# Patient Record
Sex: Female | Born: 1995 | Hispanic: Yes | Marital: Single | Smoking: Never smoker
Health system: Southern US, Community
[De-identification: ages and names within clinical notes are randomized; demographics above are authoritative.]

## PROBLEM LIST (undated history)

## (undated) DIAGNOSIS — E559 Vitamin D deficiency, unspecified: Secondary | ICD-10-CM

## (undated) DIAGNOSIS — Z91018 Allergy to other foods: Secondary | ICD-10-CM

## (undated) DIAGNOSIS — A6000 Herpesviral infection of urogenital system, unspecified: Secondary | ICD-10-CM

## (undated) DIAGNOSIS — F1010 Alcohol abuse, uncomplicated: Secondary | ICD-10-CM

## (undated) DIAGNOSIS — K219 Gastro-esophageal reflux disease without esophagitis: Secondary | ICD-10-CM

## (undated) DIAGNOSIS — K5900 Constipation, unspecified: Secondary | ICD-10-CM

## (undated) DIAGNOSIS — N979 Female infertility, unspecified: Secondary | ICD-10-CM

## (undated) DIAGNOSIS — D649 Anemia, unspecified: Secondary | ICD-10-CM

## (undated) DIAGNOSIS — F429 Obsessive-compulsive disorder, unspecified: Secondary | ICD-10-CM

## (undated) DIAGNOSIS — E282 Polycystic ovarian syndrome: Secondary | ICD-10-CM

## (undated) DIAGNOSIS — F32A Depression, unspecified: Secondary | ICD-10-CM

## (undated) DIAGNOSIS — J45909 Unspecified asthma, uncomplicated: Secondary | ICD-10-CM

## (undated) DIAGNOSIS — F419 Anxiety disorder, unspecified: Secondary | ICD-10-CM

## (undated) DIAGNOSIS — F329 Major depressive disorder, single episode, unspecified: Secondary | ICD-10-CM

## (undated) DIAGNOSIS — F909 Attention-deficit hyperactivity disorder, unspecified type: Secondary | ICD-10-CM

## (undated) HISTORY — DX: Vitamin D deficiency, unspecified: E55.9

## (undated) HISTORY — DX: Attention-deficit hyperactivity disorder, unspecified type: F90.9

## (undated) HISTORY — DX: Herpesviral infection of urogenital system, unspecified: A60.00

## (undated) HISTORY — DX: Anxiety disorder, unspecified: F41.9

## (undated) HISTORY — DX: Allergy to other foods: Z91.018

## (undated) HISTORY — DX: Depression, unspecified: F32.A

## (undated) HISTORY — DX: Constipation, unspecified: K59.00

## (undated) HISTORY — DX: Obsessive-compulsive disorder, unspecified: F42.9

## (undated) HISTORY — DX: Gastro-esophageal reflux disease without esophagitis: K21.9

## (undated) HISTORY — DX: Anemia, unspecified: D64.9

## (undated) HISTORY — DX: Female infertility, unspecified: N97.9

## (undated) HISTORY — DX: Alcohol abuse, uncomplicated: F10.10

## (undated) HISTORY — DX: Major depressive disorder, single episode, unspecified: F32.9

---
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
## 2015-02-10 DIAGNOSIS — R519 Headache, unspecified: Secondary | ICD-10-CM | POA: Insufficient documentation

## 2015-02-10 DIAGNOSIS — R51 Headache: Secondary | ICD-10-CM

## 2015-02-19 ENCOUNTER — Ambulatory Visit: Payer: No Typology Code available for payment source | Attending: Internal Medicine | Admitting: Internal Medicine

## 2015-02-19 ENCOUNTER — Encounter: Payer: Self-pay | Admitting: Internal Medicine

## 2015-02-19 VITALS — BP 108/73 | HR 90 | Temp 98.0°F | Resp 16 | Wt 179.4 lb

## 2015-02-19 DIAGNOSIS — G44219 Episodic tension-type headache, not intractable: Secondary | ICD-10-CM

## 2015-02-19 DIAGNOSIS — Z23 Encounter for immunization: Secondary | ICD-10-CM | POA: Diagnosis not present

## 2015-02-19 NOTE — Progress Notes (Signed)
Patient here for follow up Patient states that she recently had a headache That lasted for six days No history of migraines and her headache was not around the  Same time as her  Monthly cycle

## 2015-02-19 NOTE — Progress Notes (Signed)
Subjective:     Patient ID: Catherine Lane, female   DOB: 12/08/1995, 19 y.o.   MRN: [id number]  HPI   Kaysen L Kathleene Hazel is a 19 yr old female who presents today after 6 days of headaches which started on September 12 and has now resolved. Patient described pain that was worse in the morning and at bed time and resolved during the day. Pain was throbbing and mostly in frontal region, pain did not radiate. Patient does not know of any precipitant to headache episode. Patient stated she took aspirin with moderate relief.  Pain not associated with photophobia or phonophobia. Patient denies current headache pain, fever, chest or abdominal pain, nausea, vomiting or diarrhea.  Blood pressure 108/73, pulse 90, temperature 98 F (36.7 C), resp. rate 16, weight 179 lb 6.4 oz (81.375 kg), SpO2 100 %.  Review of Systems  Constitutional: Negative.   Eyes: Negative.   Respiratory: Negative.   Gastrointestinal: Negative.   Endocrine: Negative.   Genitourinary: Negative.   Musculoskeletal: Negative.   Skin: Negative.   Allergic/Immunologic: Negative.   Hematological: Negative.   Psychiatric/Behavioral: Negative.        Objective:   Physical Exam  Constitutional: She is oriented to person, place, and time. She appears well-developed and well-nourished.  HENT:  Head: Normocephalic.  Eyes: Pupils are equal, round, and reactive to light.  Neck: Normal range of motion. Neck supple.  Cardiovascular: Normal rate, regular rhythm, normal heart sounds and intact distal pulses.  Exam reveals no gallop and no friction rub.   No murmur heard. Pulmonary/Chest: Effort normal and breath sounds normal.  Abdominal: Bowel sounds are normal.  Musculoskeletal: Normal range of motion.  Neurological: She is alert and oriented to person, place, and time.  Skin: Skin is warm and dry.  Psychiatric: She has a normal mood and affect. Her behavior is normal. Judgment and thought content normal.        Assessment:   Olivya was seen today for headache.  Diagnoses and all orders for this visit:  Episodic tension-type headache, not intractable  Need for influenza vaccination -     Flu Vaccine QUAD 36+ mos IM     Plan:     Episodic tension-type headache not intractable  Patient was instructed to keep a headache diary, with reference to symptoms, pain level, length of headache and any aggravating or alleviating factors.    Patient was instructed to take ibuprofen 600 mg every eight hours for headache pain.  Patient was encouraged to call he office if symptoms worsen or persist.   Return if symptoms worsen or fail to improve.   Stephanie Coup, RN, AGNP Student  02/19/2015 5:54 PM

## 2015-02-19 NOTE — Patient Instructions (Signed)
May use ibuprofen 800 mg for headaches. If no improvement please call me and I may send you a light muscle relaxer   Tension Headache A tension headache is a feeling of pain, pressure, or aching often felt over the front and sides of the head. The pain can be dull or can feel tight (constricting). It is the most common type of headache. Tension headaches are not normally associated with nausea or vomiting and do not get worse with physical activity. Tension headaches can last 30 minutes to several days.  CAUSES  The exact cause is not known, but it may be caused by chemicals and hormones in the brain that lead to pain. Tension headaches often begin after stress, anxiety, or depression. Other triggers may include:  Alcohol.  Caffeine (too much or withdrawal).  Respiratory infections (colds, flu, sinus infections).  Dental problems or teeth clenching.  Fatigue.  Holding your head and neck in one position too long while using a computer. SYMPTOMS   Pressure around the head.   Dull, aching head pain.   Pain felt over the front and sides of the head.   Tenderness in the muscles of the head, neck, and shoulders. DIAGNOSIS  A tension headache is often diagnosed based on:   Symptoms.   Physical examination.   A CT scan or MRI of your head. These tests may be ordered if symptoms are severe or unusual. TREATMENT  Medicines may be given to help relieve symptoms.  HOME CARE INSTRUCTIONS   Only take over-the-counter or prescription medicines for pain or discomfort as directed by your caregiver.   Lie down in a dark, quiet room when you have a headache.   Keep a journal to find out what may be triggering your headaches. For example, write down:  What you eat and drink.  How much sleep you get.  Any change to your diet or medicines.  Try massage or other relaxation techniques.   Ice packs or heat applied to the head and neck can be used. Use these 3 to 4 times per day  for 15 to 20 minutes each time, or as needed.   Limit stress.   Sit up straight, and do not tense your muscles.   Quit smoking if you smoke.  Limit alcohol use.  Decrease the amount of caffeine you drink, or stop drinking caffeine.  Eat and exercise regularly.  Get 7 to 9 hours of sleep, or as recommended by your caregiver.  Avoid excessive use of pain medicine as recurrent headaches can occur.  SEEK MEDICAL CARE IF:   You have problems with the medicines you were prescribed.  Your medicines do not work.  You have a change from the usual headache.  You have nausea or vomiting. SEEK IMMEDIATE MEDICAL CARE IF:   Your headache becomes severe.  You have a fever.  You have a stiff neck.  You have loss of vision.  You have muscular weakness or loss of muscle control.  You lose your balance or have trouble walking.  You feel faint or pass out.  You have severe symptoms that are different from your first symptoms. MAKE SURE YOU:   Understand these instructions.  Will watch your condition.  Will get help right away if you are not doing well or get worse. Document Released: 05/17/2005 Document Revised: 08/09/2011 Document Reviewed: 05/07/2011 [facility] Patient Information 2015 [place], [place]. This information is not intended to replace advice given to you by your health care provider. Make sure  you discuss any questions you have with your health care provider.

## 2015-05-28 ENCOUNTER — Encounter: Payer: Self-pay | Admitting: Internal Medicine

## 2015-05-28 ENCOUNTER — Ambulatory Visit: Payer: 59 | Attending: Internal Medicine | Admitting: Internal Medicine

## 2015-05-28 VITALS — BP 126/84 | HR 89 | Temp 98.0°F | Resp 16 | Ht 62.0 in | Wt 173.4 lb

## 2015-05-28 DIAGNOSIS — R109 Unspecified abdominal pain: Secondary | ICD-10-CM | POA: Insufficient documentation

## 2015-05-28 DIAGNOSIS — E282 Polycystic ovarian syndrome: Secondary | ICD-10-CM | POA: Insufficient documentation

## 2015-05-28 DIAGNOSIS — R102 Pelvic and perineal pain: Secondary | ICD-10-CM | POA: Diagnosis not present

## 2015-05-28 LAB — POCT URINALYSIS DIPSTICK
Glucose, UA: NEGATIVE
KETONES UA: NEGATIVE
LEUKOCYTES UA: NEGATIVE
Nitrite, UA: NEGATIVE
Protein, UA: >=300
Urobilinogen, UA: 0.2
pH, UA: 5.5

## 2015-05-28 LAB — POCT URINE PREGNANCY: Preg Test, Ur: NEGATIVE

## 2015-05-28 NOTE — Progress Notes (Signed)
Patient complains of left sided abd pain for the past two days Denies any fever Having some diarrhea and some nausea as well

## 2015-05-28 NOTE — Progress Notes (Signed)
   Subjective:    Patient ID: Catherine Lane, female    DOB: 1995/10/12, 19 y.o.   MRN: [id number]  HPI Comments: Was told in June that she has PCOS and was started on birth control. She reports that she recently missed 2 weeks of her birth control which caused her to have 2 weeks of a cycle.   Abdominal Pain This is a new problem. The current episode started in the past 7 days. The pain is located in the LLQ (pelvic). The quality of the pain is aching ("achy muscle"). The abdominal pain does not radiate. Associated symptoms include diarrhea (usually has before her period) and nausea. Pertinent negatives include no fever or vomiting. Exacerbated by: bending, walking, lifting heavy objects. Treatments tried: NSAID's. The treatment provided significant relief. Ovarian Cyst    Review of Systems  Constitutional: Negative for fever.  Gastrointestinal: Positive for nausea, abdominal pain and diarrhea (usually has before her period). Negative for vomiting.  Genitourinary: Positive for pelvic pain and dyspareunia. Negative for vaginal discharge, difficulty urinating and vaginal pain.       Objective:   Physical Exam  Constitutional: She is oriented to person, place, and time.  Abdominal: Soft. Bowel sounds are normal. There is tenderness (pelvic, LLQ).  Musculoskeletal: She exhibits no tenderness.  Neurological: She is alert and oriented to person, place, and time.  Skin: Skin is warm and dry.  Psychiatric: She has a normal mood and affect.      Assessment & Plan:  Catherine Lane was seen today for abdominal pain.  Diagnoses and all orders for this visit:  Abdominal pain, unspecified abdominal location -     Urinalysis Dipstick -     POCT urine pregnancy  PCOS (polycystic ovarian syndrome) I believe when patient missed her OCP, caused her hormones to be off. Likely patient has ovarian cyst causing abdominal pain. I have encouraged regular use of OCP as directed. NSAID's encouraged. If pain  becomes worse she may call her GYN back for further evaluation.   Return if symptoms worsen or fail to improve.   Ambrose FinlandValerie A Binyomin Brann, NP 05/28/2015 9:57 AM

## 2015-05-28 NOTE — Patient Instructions (Signed)
Ovarian Cyst An ovarian cyst is a fluid-filled sac that forms on an ovary. The ovaries are small organs that produce eggs in women. Various types of cysts can form on the ovaries. Most are not cancerous. Many do not cause problems, and they often go away on their own. Some may cause symptoms and require treatment. Common types of ovarian cysts include:  Functional cysts--These cysts may occur every month during the menstrual cycle. This is normal. The cysts usually go away with the next menstrual cycle if the woman does not get pregnant. Usually, there are no symptoms with a functional cyst.  Endometrioma cysts--These cysts form from the tissue that lines the uterus. They are also called "chocolate cysts" because they become filled with blood that turns brown. This type of cyst can cause pain in the lower abdomen during intercourse and with your menstrual period.  Cystadenoma cysts--This type develops from the cells on the outside of the ovary. These cysts can get very big and cause lower abdomen pain and pain with intercourse. This type of cyst can twist on itself, cut off its blood supply, and cause severe pain. It can also easily rupture and cause a lot of pain.  Dermoid cysts--This type of cyst is sometimes found in both ovaries. These cysts may contain different kinds of body tissue, such as skin, teeth, hair, or cartilage. They usually do not cause symptoms unless they get very big.  Theca lutein cysts--These cysts occur when too much of a certain hormone (human chorionic gonadotropin) is produced and overstimulates the ovaries to produce an egg. This is most common after procedures used to assist with the conception of a baby (in vitro fertilization). CAUSES   Fertility drugs can cause a condition in which multiple large cysts are formed on the ovaries. This is called ovarian hyperstimulation syndrome.  A condition called polycystic ovary syndrome can cause hormonal imbalances that can lead to  nonfunctional ovarian cysts. SIGNS AND SYMPTOMS  Many ovarian cysts do not cause symptoms. If symptoms are present, they may include:  Pelvic pain or pressure.  Pain in the lower abdomen.  Pain during sexual intercourse.  Increasing girth (swelling) of the abdomen.  Abnormal menstrual periods.  Increasing pain with menstrual periods.  Stopping having menstrual periods without being pregnant. DIAGNOSIS  These cysts are commonly found during a routine or annual pelvic exam. Tests may be ordered to find out more about the cyst. These tests may include:  Ultrasound.  X-ray of the pelvis.  CT scan.  MRI.  Blood tests. TREATMENT  Many ovarian cysts go away on their own without treatment. Your health care provider may want to check your cyst regularly for 2-3 months to see if it changes. For women in menopause, it is particularly important to monitor a cyst closely because of the higher rate of ovarian cancer in menopausal women. When treatment is needed, it may include any of the following:  A procedure to drain the cyst (aspiration). This may be done using a long needle and ultrasound. It can also be done through a laparoscopic procedure. This involves using a thin, lighted tube with a tiny camera on the end (laparoscope) inserted through a small incision.  Surgery to remove the whole cyst. This may be done using laparoscopic surgery or an open surgery involving a larger incision in the lower abdomen.  Hormone treatment or birth control pills. These methods are sometimes used to help dissolve a cyst. HOME CARE INSTRUCTIONS   Only take over-the-counter   or prescription medicines as directed by your health care provider.  Follow up with your health care provider as directed.  Get regular pelvic exams and Pap tests. SEEK MEDICAL CARE IF:   Your periods are late, irregular, or painful, or they stop.  Your pelvic pain or abdominal pain does not go away.  Your abdomen becomes  larger or swollen.  You have pressure on your bladder or trouble emptying your bladder completely.  You have pain during sexual intercourse.  You have feelings of fullness, pressure, or discomfort in your stomach.  You lose weight for no apparent reason.  You feel generally ill.  You become constipated.  You lose your appetite.  You develop acne.  You have an increase in body and facial hair.  You are gaining weight, without changing your exercise and eating habits.  You think you are pregnant. SEEK IMMEDIATE MEDICAL CARE IF:   You have increasing abdominal pain.  You feel sick to your stomach (nauseous), and you throw up (vomit).  You develop a fever that comes on suddenly.  You have abdominal pain during a bowel movement.  Your menstrual periods become heavier than usual. MAKE SURE YOU:  Understand these instructions.  Will watch your condition.  Will get help right away if you are not doing well or get worse.   This information is not intended to replace advice given to you by your health care provider. Make sure you discuss any questions you have with your health care provider.   Document Released: 05/17/2005 Document Revised: 05/22/2013 Document Reviewed: 01/22/2013 Elsevier Interactive Patient Education 2016 [employer]  

## 2015-10-17 ENCOUNTER — Encounter: Payer: Self-pay | Admitting: General Practice

## 2015-10-17 ENCOUNTER — Other Ambulatory Visit: Payer: Self-pay | Admitting: General Practice

## 2015-10-17 ENCOUNTER — Other Ambulatory Visit: Payer: Self-pay

## 2015-10-17 DIAGNOSIS — Z3041 Encounter for surveillance of contraceptive pills: Secondary | ICD-10-CM

## 2015-10-17 MED ORDER — NORGESTIMATE-ETH ESTRADIOL 0.25-35 MG-MCG PO TABS: 1.0000 | ORAL_TABLET | Freq: Every day | ORAL | Status: DC

## 2015-10-17 NOTE — Telephone Encounter (Signed)
Pt came to front office and requested a refill on her BCP.  Per Dr. Debroah LoopArnold, pt can have a refill of one year. Rx e-prescribed.

## 2015-10-22 ENCOUNTER — Telehealth: Payer: Self-pay | Admitting: General Practice

## 2015-10-22 DIAGNOSIS — Z3041 Encounter for surveillance of contraceptive pills: Secondary | ICD-10-CM

## 2015-10-22 MED ORDER — NORGESTIMATE-ETH ESTRADIOL 0.25-35 MG-MCG PO TABS: 1.0000 | ORAL_TABLET | Freq: Every day | ORAL | Status: DC

## 2015-10-22 NOTE — Telephone Encounter (Signed)
Patient called and left message stating she was seen a couple days for a refill on her birth control pills. Patient states she was told she didn't need an appt since she is not 21 yet & that a prescription can just go to her pharmacy. Patient states she went to the pharmacy but her medication isn't there. Called patient & informed her of Rx resubmitted. Patient verbalized understanding & had no questions

## 2015-10-24 ENCOUNTER — Telehealth: Payer: Self-pay | Admitting: Internal Medicine

## 2015-10-24 MED ORDER — ALBUTEROL SULFATE HFA 108 (90 BASE) MCG/ACT IN AERS: 2.0000 | INHALATION_SPRAY | Freq: Four times a day (QID) | RESPIRATORY_TRACT | Status: DC | PRN

## 2015-10-24 NOTE — Telephone Encounter (Signed)
Patient is requesting a prescription refill for albuterol...   Please follow up with patient

## 2015-10-24 NOTE — Telephone Encounter (Signed)
Albuterol inhaler refilled 

## 2016-04-21 ENCOUNTER — Ambulatory Visit: Payer: Self-pay

## 2016-04-29 ENCOUNTER — Encounter: Payer: Self-pay | Admitting: Licensed Clinical Social Worker

## 2016-04-29 ENCOUNTER — Ambulatory Visit: Payer: BLUE CROSS/BLUE SHIELD | Attending: Family Medicine | Admitting: Family Medicine

## 2016-04-29 ENCOUNTER — Encounter: Payer: Self-pay | Admitting: Family Medicine

## 2016-04-29 VITALS — BP 122/84 | HR 91 | Temp 99.1°F | Resp 16 | Ht 62.0 in | Wt 173.0 lb

## 2016-04-29 DIAGNOSIS — Z79899 Other long term (current) drug therapy: Secondary | ICD-10-CM | POA: Insufficient documentation

## 2016-04-29 DIAGNOSIS — M25579 Pain in unspecified ankle and joints of unspecified foot: Secondary | ICD-10-CM | POA: Diagnosis not present

## 2016-04-29 DIAGNOSIS — M25552 Pain in left hip: Secondary | ICD-10-CM | POA: Diagnosis not present

## 2016-04-29 DIAGNOSIS — M791 Myalgia: Secondary | ICD-10-CM | POA: Diagnosis not present

## 2016-04-29 DIAGNOSIS — M25551 Pain in right hip: Secondary | ICD-10-CM | POA: Insufficient documentation

## 2016-04-29 DIAGNOSIS — J4520 Mild intermittent asthma, uncomplicated: Secondary | ICD-10-CM | POA: Diagnosis not present

## 2016-04-29 DIAGNOSIS — Z0001 Encounter for general adult medical examination with abnormal findings: Secondary | ICD-10-CM | POA: Insufficient documentation

## 2016-04-29 DIAGNOSIS — Z23 Encounter for immunization: Secondary | ICD-10-CM | POA: Diagnosis not present

## 2016-04-29 DIAGNOSIS — M79609 Pain in unspecified limb: Secondary | ICD-10-CM

## 2016-04-29 MED ORDER — ALBUTEROL SULFATE HFA 108 (90 BASE) MCG/ACT IN AERS: 2.0000 | INHALATION_SPRAY | Freq: Four times a day (QID) | RESPIRATORY_TRACT | 0 refills | Status: DC | PRN

## 2016-04-29 MED ORDER — BECLOMETHASONE DIPROPIONATE 40 MCG/ACT IN AERS: 2.0000 | INHALATION_SPRAY | Freq: Two times a day (BID) | RESPIRATORY_TRACT | 4 refills | Status: DC

## 2016-04-29 MED ORDER — IBUPROFEN 600 MG PO TABS: 600.0000 mg | ORAL_TABLET | Freq: Three times a day (TID) | ORAL | 0 refills | Status: DC | PRN

## 2016-04-29 NOTE — BH Specialist Note (Signed)
Session Start time: 9:55 am   End Time: 10:15 am Total Time:  20 minutes Type of Service: Behavioral Health - Individual/Family Interpreter: No.   Interpreter Name & Language: N/A # [facility]   SUBJECTIVE: Catherine Lane is a 20 y.o. female  Pt. was referred by FNP Hairston for:  anxiety and depression. Pt. reports the following symptoms/concerns: frequent panic attacks, episodes of crying, and withdrawn behavior Duration of problem:  2 years Severity: moderate Previous treatment: Pt was diagnosed with Depression, Anxiety, and [facility] ago [facility]. [facility]. Pt no longer receiving services.   OBJECTIVE: Mood: Pleasant & Affect: Appropriate Risk of harm to self or others: Pt has hx of suicidal ideations; however, denied SI/HI at present time Assessments administered: PHQ-9; GAD-7  LIFE CONTEXT:  Family & Social: Pt resides with her parents. She has friends that she receives emotional support from School/ Work: Pt is a Consulting civil engineerstudent at [employer]. She receives financial support from parents and employer Self-Care: Pt reported that she wants to begin an exercise regimen; however, she is too busy with school and work. Pt denied substance use and did not report concerns with sleep and/or appetite Life changes: Pt has difficulty balancing work and school What is important to pt/family (values): Family, Friendships   GOALS ADDRESSED:  Decrease symptoms of depression Decrease symptoms of anxiety  INTERVENTIONS: Solution Focused, Strength-based and Supportive   ASSESSMENT:  Pt currently experiencing depression and anxiety triggered by increased stress from school and work. Pt reports frequent panic attacks and episodes of crying. Pt may benefit from psychotherapy and medication management. LCSWA educated pt on how stress can impact mental health conditions. Pt identified multiple  strategies to cope with increased stress, in addition, to panic attacks. Pt reported that she may be open to medication management. LCSWA provided pt with behavioral health resources and encouraged pt to re-initiate services.      PLAN: 1. F/U with behavioral health clinician: Pt was encouraged to contact LCSWA if symptoms worsen or fail to improve to schedule [facility]. 2. Behavioral Health meds: None reported 3. Behavioral recommendations: LCSWA recommends that pt apply healthy coping skills discussed. Pt is encouraged to schedule follow up appointment with LCSWA and initiate medication management with community agency 4. Referral: [facility] Counseling/Psychotherapy, [facility], Problem-solving teaching/coping strategies, Psychoeducation and Supportive Counseling 5. From scale of 1-10, how likely are you to follow plan: 8/10   Bridgett LarssonJasmine D Lewis, MSW, LCSWA  Clinical Social Worker 04/29/16 11:05 am  Warmhandoff:   Warm Hand Off Completed.

## 2016-04-29 NOTE — Patient Instructions (Addendum)
Influenza Virus Vaccine injection (Fluarix) What is this medicine? INFLUENZA VIRUS VACCINE (in floo EN zuh VAHY ruhs vak SEEN) helps to reduce the risk of getting influenza also known as the flu. This medicine may be used for other purposes; ask your health care provider or pharmacist if you have questions. COMMON BRAND NAME(S): Fluarix, Fluzone What should I tell my health care provider before I take this medicine? They need to know if you have any of these conditions: -bleeding disorder like hemophilia -fever or infection -Guillain-Barre syndrome or other neurological problems -immune system problems -infection with the human immunodeficiency virus (HIV) or AIDS -low blood platelet counts -multiple sclerosis -an unusual or allergic reaction to influenza virus vaccine, eggs, chicken proteins, latex, gentamicin, other medicines, foods, dyes or preservatives -pregnant or trying to get pregnant -breast-feeding How should I use this medicine? This vaccine is for injection into a muscle. It is given by a health care professional. A copy of Vaccine Information Statements will be given before each vaccination. Read this sheet carefully each time. The sheet may change frequently. Talk to your pediatrician regarding the use of this medicine in children. Special care may be needed. Overdosage: If you think you have taken too much of this medicine contact a poison control center or emergency room at once. NOTE: This medicine is only for you. Do not share this medicine with others. What if I miss a dose? This does not apply. What may interact with this medicine? -chemotherapy or radiation therapy -medicines that lower your immune system like etanercept, anakinra, infliximab, and adalimumab -medicines that treat or prevent blood clots like warfarin -phenytoin -steroid medicines like prednisone or cortisone -theophylline -vaccines This list may not describe all possible interactions. Give  your health care provider a list of all the medicines, herbs, non-prescription drugs, or dietary supplements you use. Also tell them if you smoke, drink alcohol, or use illegal drugs. Some items may interact with your medicine. What should I watch for while using this medicine? Report any side effects that do not go away within 3 days to your doctor or health care professional. Call your health care provider if any unusual symptoms occur within 6 weeks of receiving this vaccine. You may still catch the flu, but the illness is not usually as bad. You cannot get the flu from the vaccine. The vaccine will not protect against colds or other illnesses that may cause fever. The vaccine is needed every year. What side effects may I notice from receiving this medicine? Side effects that you should report to your doctor or health care professional as soon as possible: -allergic reactions like skin rash, itching or hives, swelling of the face, lips, or tongue Side effects that usually do not require medical attention (report to your doctor or health care professional if they continue or are bothersome): -fever -headache -muscle aches and pains -pain, tenderness, redness, or swelling at site where injected -weak or tired This list may not describe all possible side effects. Call your doctor for medical advice about side effects. You may report side effects to FDA at 1-800-FDA-1088. Where should I keep my medicine? This vaccine is only given in a clinic, pharmacy, doctor's office, or other health care setting and will not be stored at home. NOTE: This sheet is a summary. It may not cover all possible information. If you have questions about this medicine, talk to your doctor, pharmacist, or health care provider.  2017 Elsevier/Gold Standard (2007-12-13 09:30:40)  Musculoskeletal Pain  Musculoskeletal pain is muscle and bone aches and pains. This pain can occur in any part of the body. Follow these  instructions at home:  Only take medicines for pain, discomfort, or fever as told by your health care provider.  You may continue all activities unless the activities cause more pain. When the pain lessens, slowly resume normal activities. Gradually increase the intensity and duration of the activities or exercise.  During periods of severe pain, bed rest may be helpful. Lie or sit in any position that is comfortable, but get out of bed and walk around at least every several hours.  If directed, put ice on the injured area.  Put ice in a plastic bag.  Place a towel between your skin and the bag.  Leave the ice on for 20 minutes, 2-3 times a day. Contact a health care provider if:  Your pain is getting worse.  Your pain is not relieved with medicines.  You lose function in the area of the pain if the pain is in your arms, legs, or neck. This information is not intended to replace advice given to you by your health care provider. Make sure you discuss any questions you have with your health care provider. Document Released: 05/17/2005 Document Revised: 10/28/2015 Document Reviewed: 01/19/2013 Elsevier Interactive Patient Education  2017 Elsevier Inc. DTaP Vaccine (Diphtheria, Tetanus, and Pertussis): What You Need to Know 1. Why get vaccinated? Diphtheria, tetanus, and pertussis are serious diseases caused by bacteria. Diphtheria and pertussis are spread from person to person. Tetanus enters the body through cuts or wounds. DIPHTHERIA causes a thick covering in the back of the throat.  It can lead to breathing problems, paralysis, heart failure, and even death. TETANUS (Lockjaw) causes painful tightening of the muscles, usually all over the body.  It can lead to "locking" of the jaw so the victim cannot open his mouth or swallow. Tetanus leads to death in up to 2 out of 10 cases. PERTUSSIS (Whooping Cough) causes coughing spells so bad that it is hard for infants to eat, drink, or  breathe. These spells can last for weeks.  It can lead to pneumonia, seizures (jerking and staring spells), brain damage, and death. Diphtheria, tetanus, and pertussis vaccine (DTaP) can help prevent these diseases. Most children who are vaccinated with DTaP will be protected throughout childhood. Many more children would get these diseases if we stopped vaccinating. DTaP is a safer version of an older vaccine called DTP. DTP is no longer used in the [place]. 2. Who should get DTaP vaccine and when? Children should get 5 doses of DTaP vaccine, one dose at each of the following ages:  2 months  4 months  6 months  15-18 months  4-6 years DTaP may be given at the same time as other vaccines. 3. Some children should not get DTaP vaccine or should wait  Children with minor illnesses, such as a cold, may be vaccinated. But children who are moderately or severely ill should usually wait until they recover before getting DTaP vaccine.  Any child who had a life-threatening allergic reaction after a dose of DTaP should not get another dose.  Any child who suffered a brain or nervous system disease within 7 days after a dose of DTaP should not get another dose.  Talk with your doctor if your child:  had a seizure or collapsed after a dose of DTaP,  cried non-stop for 3 hours or more after a dose of DTaP,  had a fever over 105F after a dose of DTaP. Ask your doctor for more information. Some of these children should not get another dose of pertussis vaccine, but may get a vaccine without pertussis, called DT. 4. Older children and adults DTaP is not licensed for adolescents, adults, or children 767 years of age and older. But older people still need protection. A vaccine called Tdap is similar to DTaP. A single dose of Tdap is recommended for people 11 through 20 years of age. Another vaccine, called Td, protects against tetanus and diphtheria, but not pertussis. It is recommended every  10 years. There are separate Vaccine Information Statements for these vaccines. 5. What are the risks from DTaP vaccine? Getting diphtheria, tetanus, or pertussis disease is much riskier than getting DTaP vaccine. However, a vaccine, like any medicine, is capable of causing serious problems, such as severe allergic reactions. The risk of DTaP vaccine causing serious harm, or death, is extremely small. Mild problems (common)  Fever (up to about 1 child in 4)  Redness or swelling where the shot was given (up to about 1 child in 4)  Soreness or tenderness where the shot was given (up to about 1 child in 4) These problems occur more often after the 4th and 5th doses of the DTaP series than after earlier doses. Sometimes the 4th or 5th dose of DTaP vaccine is followed by swelling of the entire arm or leg in which the shot was given, lasting 1-7 days (up to about 1 child in 30). Other mild problems include:  Fussiness (up to about 1 child in 3)  Tiredness or poor appetite (up to about 1 child in 10)  Vomiting (up to about 1 child in 50) These problems generally occur 1-3 days after the shot. Moderate problems (uncommon)  Seizure (jerking or staring) (about 1 child out of 14,000)  Non-stop crying, for 3 hours or more (up to about 1 child out of 1,000)  High fever, over 105F (about 1 child out of 16,000) Severe problems (very rare)  Serious allergic reaction (less than 1 out of a million doses)  Several other severe problems have been reported after DTaP vaccine. These include:  Long-term seizures, coma, or lowered consciousness  Permanent brain damage. These are so rare it is hard to tell if they are caused by the vaccine. Controlling fever is especially important for children who have had seizures, for any reason. It is also important if another family member has had seizures. You can reduce fever and pain by giving your child an aspirin-free pain reliever when the shot is given, and  for the next 24 hours, following the package instructions. 6. What if there is a serious reaction? What should I look for? Look for anything that concerns you, such as signs of a severe allergic reaction, very high fever, or behavior changes. Signs of a severe allergic reaction can include hives, swelling of the face and throat, difficulty breathing, a fast heartbeat, dizziness, and weakness. These would start a few minutes to a few hours after the vaccination. What should I do?  If you think it is a severe allergic reaction or other emergency that can't wait, call 9-1-1 or get the person to the nearest hospital. Otherwise, call your doctor.  Afterward, the reaction should be reported to the Vaccine Adverse Event Reporting System (VAERS). Your doctor might file this report, or you can do it yourself through the VAERS web site at www.vaers.LAgents.nohhs.gov, or by calling 1-(831)012-1299.  VAERS is only for reporting reactions. They do not give medical advice. 7. The National Vaccine Injury Compensation Program The [employer] Vaccine Injury Compensation Program (VICP) is a federal program that was created to compensate people who may have been injured by certain vaccines. Persons who believe they may have been injured by a vaccine can learn about the program and about filing a claim by calling 1-(929)222-4460 or visiting the VICP website at SpiritualWord.atwww.hrsa.gov/vaccinecompensation. 8. How can I learn more?  Ask your doctor.  Call your local or state health department.  Contact the Centers for Disease Control and Prevention (CDC):  Call (330)764-26421-(707)240-7207 (1-800-CDC-INFO) or  Visit CDC's website at PicCapture.uywww.cdc.gov/vaccines CDC DTaP Vaccine (Diphtheria, Tetanus, and Pertussis) VIS (10/14/05) This information is not intended to replace advice given to you by your health care provider. Make sure you discuss any questions you have with your health care provider. Document Released: 03/14/2006 Document Revised: 02/05/2016  Document Reviewed: 02/05/2016 Elsevier Interactive Patient Education  2017 Elsevier Inc.  Asthma, Adult Asthma is a condition of the lungs in which the airways tighten and narrow. Asthma can make it hard to breathe. Asthma cannot be cured, but medicine and lifestyle changes can help control it. Asthma may be started (triggered) by:  Animal skin flakes (dander).  Dust.  Cockroaches.  Pollen.  Mold.  Smoke.  Cleaning products.  Hair sprays or aerosol sprays.  Paint fumes or strong smells.  Cold air, weather changes, and winds.  Crying or laughing hard.  Stress.  Certain medicines or drugs.  Foods, such as dried fruit, potato chips, and sparkling grape juice.  Infections or conditions (colds, flu).  Exercise.  Certain medical conditions or diseases.  Exercise or tiring activities. Follow these instructions at home:  Take medicine as told by your doctor.  Use a peak flow meter as told by your doctor. A peak flow meter is a tool that measures how well the lungs are working.  Record and keep track of the peak flow meter's readings.  Understand and use the asthma action plan. An asthma action plan is a written plan for taking care of your asthma and treating your attacks.  To help prevent asthma attacks:  Do not smoke. Stay away from secondhand smoke.  Change your heating and air conditioning filter often.  Limit your use of fireplaces and wood stoves.  Get rid of pests (such as roaches and mice) and their droppings.  Throw away plants if you see mold on them.  Clean your floors. Dust regularly. Use cleaning products that do not smell.  Have someone vacuum when you are not home. Use a vacuum cleaner with a HEPA filter if possible.  Replace carpet with wood, tile, or vinyl flooring. Carpet can trap animal skin flakes and dust.  Use allergy-proof pillows, mattress covers, and box spring covers.  Wash bed sheets and blankets every week in hot water and  dry them in a dryer.  Use blankets that are made of polyester or cotton.  Clean bathrooms and kitchens with bleach. If possible, have someone repaint the walls in these rooms with mold-resistant paint. Keep out of the rooms that are being cleaned and painted.  Wash hands often. Contact a doctor if:  You have make a whistling sound when breaking (wheeze), have shortness of breath, or have a cough even if taking medicine to prevent attacks.  The colored mucus you cough up (sputum) is thicker than usual.  The colored mucus you cough up changes from clear or white  to yellow, green, gray, or bloody.  You have problems from the medicine you are taking such as:  A rash.  Itching.  Swelling.  Trouble breathing.  You need reliever medicines more than 2-3 times a week.  Your peak flow measurement is still at 50-79% of your personal best after following the action plan for 1 hour.  You have a fever. Get help right away if:  You seem to be worse and are not responding to medicine during an asthma attack.  You are short of breath even at rest.  You get short of breath when doing very little activity.  You have trouble eating, drinking, or talking.  You have chest pain.  You have a fast heartbeat.  Your lips or fingernails start to turn blue.  You are light-headed, dizzy, or faint.  Your peak flow is less than 50% of your personal best. This information is not intended to replace advice given to you by your health care provider. Make sure you discuss any questions you have with your health care provider. Document Released: 11/03/2007 Document Revised: 10/23/2015 Document Reviewed: 12/14/2012 Elsevier Interactive Patient Education  2017 [employer]. ------  Schedule appointment for Physical/f/u birth control

## 2016-04-29 NOTE — Progress Notes (Signed)
Flare ups of leg pain greater than a year. Feels like its in the bone/limits movements.

## 2016-04-29 NOTE — Progress Notes (Signed)
Subjective:  Patient ID: Catherine Lane, female    DOB: 04/28/1996  Age: 20 y.o. MRN: [id number]  CC: Leg Pain   HPI Catherine Lane presents for complaints of bilateral hip,knee, and ankle pain. She these symptoms began 3 years ago. She reports the pain as intermittment. She reports this current episode of pain began last week while she was standing at work. She denies any history of injury/trauma. She denies any rashes, fatigue, fever or sensitivity to light.  She denies any coolness or warmth of the extremities. She denies any extended periods of travel. She reports swelling of the lower legs. 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 Patient also has history of asthma. She is unsure how much asthma medication she has left. She reported using her rescue inhaler last about 2 weeks ago. She denies SOB. She denies increased inhaler use.   Outpatient Medications Prior to Visit  Medication Sig Dispense Refill  . norgestimate-ethinyl estradiol (ORTHO-CYCLEN,SPRINTEC,PREVIFEM) 0.25-35 MG-MCG tablet Take 1 tablet by mouth daily. 1 Package 11  . albuterol (PROVENTIL HFA;VENTOLIN HFA) 108 (90 Base) MCG/ACT inhaler Inhale 2 puffs into the lungs every 6 (six) hours as needed for wheezing or shortness of breath. 1 Inhaler 0  . beclomethasone (QVAR) 40 MCG/ACT inhaler Inhale 2 puffs into the lungs 2 (two) times daily. 1 Inhaler 4  . norgestimate-ethinyl estradiol (ORTHO-CYCLEN,SPRINTEC,PREVIFEM) 0.25-35 MG-MCG tablet Take 1 tablet by mouth daily. 1 Package 11  . sertraline (ZOLOFT) 50 MG tablet Take 50 mg by mouth daily.     No facility-administered medications prior to visit.     ROS Review of Systems  Constitutional: Negative.   Respiratory: Negative.   Cardiovascular: Negative for leg swelling.  Musculoskeletal: Positive for myalgias.  Skin: Negative.     Objective:  BP 122/84   Pulse 91   Temp 99.1 F (37.3 C) (Oral)   Resp 16   Ht 5\' 2"   (1.575 m)   Wt 173 lb (78.5 kg)   LMP 04/15/2016   SpO2 97%   BMI 31.64 kg/m   BP/Weight 04/29/2016 05/28/2015 02/19/2015  Systolic BP 122 126 108  Diastolic BP 84 84 73  Wt. (Lbs) 173 173.4 179.4  BMI 31.64 31.71 32.8   Physical Exam  Constitutional: She appears well-developed and well-nourished.  Cardiovascular: Normal rate, regular rhythm and normal heart sounds.   Pulses:      Dorsalis pedis pulses are 2+ on the right side, and 2+ on the left side.  Pulmonary/Chest: Effort normal and breath sounds normal.  Musculoskeletal: She exhibits no edema.  Pain with adduction of bilateral hip. No pain with abduction of bilateral hip. No tenderness with palpation to bilateral hip, knee, or ankle, joints. No tenderness to bilateral lower leg.  Skin: Skin is warm and dry.  Psychiatric: She has a normal mood and affect. Her behavior is normal. Thought content normal.     Assessment & Plan:   1. Musculoskeletal pain of extremity - ibuprofen (ADVIL,MOTRIN) 600 MG tablet; Take 1 tablet (600 mg total) by mouth every 8 (eight) hours as needed (Take with food.).  Dispense: 21 tablet; Refill: 0 -Recommended the use of insole support aids.  2. Mild intermittent asthma without complication - History of asthma medications reordered. - albuterol (PROVENTIL HFA;VENTOLIN HFA) 108 (90 Base) MCG/ACT inhaler; Inhale 2 puffs into the lungs every 6 (six) hours as needed for wheezing or shortness of breath.  Dispense: 1 Inhaler; Refill: 0 - beclomethasone (QVAR)  40 MCG/ACT inhaler; Inhale 2 puffs into the lungs 2 (two) times daily.  Dispense: 1 Inhaler; Refill: 4 - Tdap vaccine greater than or equal to 7yo IM  3. Encounter for immunization - Flu Vaccine QUAD 36+ mos IM   Meds ordered this encounter  Medications  . ibuprofen (ADVIL,MOTRIN) 600 MG tablet    Sig: Take 1 tablet (600 mg total) by mouth every 8 (eight) hours as needed (Take with food.).    Dispense:  21 tablet    Refill:  0    Order  Specific Question:   Supervising Provider    Answer:   Quentin AngstJEGEDE, OLUGBEMIGA E [id number]]  . albuterol (PROVENTIL HFA;VENTOLIN HFA) 108 (90 Base) MCG/ACT inhaler    Sig: Inhale 2 puffs into the lungs every 6 (six) hours as needed for wheezing or shortness of breath.    Dispense:  1 Inhaler    Refill:  0    Order Specific Question:   Supervising Provider    Answer:   Quentin AngstJEGEDE, OLUGBEMIGA E [id number]]  . beclomethasone (QVAR) 40 MCG/ACT inhaler    Sig: Inhale 2 puffs into the lungs 2 (two) times daily.    Dispense:  1 Inhaler    Refill:  4    Order Specific Question:   Supervising Provider    Answer:   Quentin AngstJEGEDE, OLUGBEMIGA E [id number]    Follow-up: Return If symptoms worsen or fail to improve.   Lizbeth BarkMandesia R Artelia Game FNP

## 2016-06-21 ENCOUNTER — Encounter: Payer: Self-pay | Admitting: Family Medicine

## 2016-06-21 ENCOUNTER — Ambulatory Visit: Payer: BLUE CROSS/BLUE SHIELD | Attending: Family Medicine | Admitting: Family Medicine

## 2016-06-21 VITALS — BP 95/64 | HR 73 | Temp 98.3°F | Resp 18 | Ht 62.0 in | Wt 169.0 lb

## 2016-06-21 DIAGNOSIS — Z825 Family history of asthma and other chronic lower respiratory diseases: Secondary | ICD-10-CM | POA: Diagnosis not present

## 2016-06-21 DIAGNOSIS — J4520 Mild intermittent asthma, uncomplicated: Secondary | ICD-10-CM

## 2016-06-21 DIAGNOSIS — F429 Obsessive-compulsive disorder, unspecified: Secondary | ICD-10-CM | POA: Insufficient documentation

## 2016-06-21 DIAGNOSIS — Z8249 Family history of ischemic heart disease and other diseases of the circulatory system: Secondary | ICD-10-CM | POA: Diagnosis not present

## 2016-06-21 DIAGNOSIS — Z79899 Other long term (current) drug therapy: Secondary | ICD-10-CM | POA: Insufficient documentation

## 2016-06-21 DIAGNOSIS — Z0000 Encounter for general adult medical examination without abnormal findings: Secondary | ICD-10-CM

## 2016-06-21 DIAGNOSIS — F329 Major depressive disorder, single episode, unspecified: Secondary | ICD-10-CM | POA: Diagnosis not present

## 2016-06-21 DIAGNOSIS — Z7951 Long term (current) use of inhaled steroids: Secondary | ICD-10-CM | POA: Diagnosis not present

## 2016-06-21 DIAGNOSIS — F419 Anxiety disorder, unspecified: Secondary | ICD-10-CM | POA: Insufficient documentation

## 2016-06-21 DIAGNOSIS — Z833 Family history of diabetes mellitus: Secondary | ICD-10-CM | POA: Diagnosis not present

## 2016-06-21 LAB — HEMOGLOBIN A1C
HEMOGLOBIN A1C: 4.9 % (ref <5.7)
Mean Plasma Glucose: 94 mg/dL

## 2016-06-21 LAB — HIV ANTIBODY (ROUTINE TESTING W REFLEX): HIV: NONREACTIVE

## 2016-06-21 LAB — CBC WITH DIFFERENTIAL/PLATELET
BASOS ABS: 0 {cells}/uL (ref 0–200)
Basophils Relative: 0 %
EOS ABS: 688 {cells}/uL — AB (ref 15–500)
Eosinophils Relative: 8 %
HEMATOCRIT: 41.1 % (ref 35.0–45.0)
HEMOGLOBIN: 13.7 g/dL (ref 11.7–15.5)
LYMPHS ABS: 3784 {cells}/uL (ref 850–3900)
Lymphocytes Relative: 44 %
MCH: 27 pg (ref 27.0–33.0)
MCHC: 33.3 g/dL (ref 32.0–36.0)
MCV: 80.9 fL (ref 80.0–100.0)
MONO ABS: 602 {cells}/uL (ref 200–950)
MONOS PCT: 7 %
MPV: 9.7 fL (ref 7.5–12.5)
NEUTROS ABS: 3526 {cells}/uL (ref 1500–7800)
Neutrophils Relative %: 41 %
Platelets: 412 10*3/uL — ABNORMAL HIGH (ref 140–400)
RBC: 5.08 MIL/uL (ref 3.80–5.10)
RDW: 14.1 % (ref 11.0–15.0)
WBC: 8.6 10*3/uL (ref 3.8–10.8)

## 2016-06-21 LAB — BASIC METABOLIC PANEL
BUN: 10 mg/dL (ref 7–25)
CHLORIDE: 105 mmol/L (ref 98–110)
CO2: 27 mmol/L (ref 20–31)
CREATININE: 0.75 mg/dL (ref 0.50–1.10)
Calcium: 9.6 mg/dL (ref 8.6–10.2)
Glucose, Bld: 88 mg/dL (ref 65–99)
POTASSIUM: 4.2 mmol/L (ref 3.5–5.3)
Sodium: 137 mmol/L (ref 135–146)

## 2016-06-21 LAB — TSH: TSH: 1.14 mIU/L

## 2016-06-21 MED ORDER — ALBUTEROL SULFATE HFA 108 (90 BASE) MCG/ACT IN AERS: 2.0000 | INHALATION_SPRAY | RESPIRATORY_TRACT | 2 refills | Status: DC | PRN

## 2016-06-21 MED ORDER — BECLOMETHASONE DIPROPIONATE 40 MCG/ACT IN AERS: 2.0000 | INHALATION_SPRAY | Freq: Two times a day (BID) | RESPIRATORY_TRACT | 2 refills | Status: DC

## 2016-06-21 NOTE — Progress Notes (Signed)
Patient is here for Physical/ F/UP Birth control  Patient declined no Pain today  Patient have not eaten today

## 2016-06-21 NOTE — Progress Notes (Signed)
Subjective:   Patient ID: Catherine Lane, female    DOB: 09-Jan-1996, 21 y.o.   MRN: [id number]  Chief Complaint  Patient presents with  . Annual Exam   HPI [name] 21 y.o. female presents for annual physical exam.The patient reports history of asthma. Reports albuterol inhaler use last week twice due to exacerbation of asthma symptoms. She reports cold weather and dust triggers asthma symptoms. History of depression, patient reports discontinuation of Zoloft since October 2017. Denies SI/HI . She reports having counseling resources available. Denies symptoms of all other pertinent systems.  Past Medical History:  Diagnosis Date  . Anemia   . Anxiety   . Depression   . OCD (obsessive compulsive disorder)     Family History  Problem Relation Age of Onset  . Asthma Father   . Hyperlipidemia Father   . Hypertension Father   . Diabetes Father   . Hyperlipidemia Paternal Aunt   . Cancer Paternal Uncle   . Diabetes Paternal Uncle   . Hyperlipidemia Paternal Grandmother     Social History   Social History  . Marital status: Single    Spouse name: N/A  . Number of children: N/A  . Years of education: N/A   Occupational History  . Careers adviser    Social History Main Topics  . Smoking status: Never Smoker  . Smokeless tobacco: Never Used  . Alcohol use No  . Drug use: No  . Sexual activity: No    Outpatient Medications Prior to Visit  Medication Sig Dispense Refill  . norgestimate-ethinyl estradiol (ORTHO-CYCLEN,SPRINTEC,PREVIFEM) 0.25-35 MG-MCG tablet Take 1 tablet by mouth daily. 1 Package 11  . norgestimate-ethinyl estradiol (ORTHO-CYCLEN,SPRINTEC,PREVIFEM) 0.25-35 MG-MCG tablet Take 1 tablet by mouth daily. 1 Package 11  . ibuprofen (ADVIL,MOTRIN) 600 MG tablet Take 1 tablet (600 mg total) by mouth every 8 (eight) hours as needed (Take with food.). (Patient not taking: Reported on 06/21/2016) 21 tablet 0  . albuterol (PROVENTIL HFA;VENTOLIN HFA)  108 (90 Base) MCG/ACT inhaler Inhale 2 puffs into the lungs every 6 (six) hours as needed for wheezing or shortness of breath. (Patient not taking: Reported on 06/21/2016) 1 Inhaler 0  . beclomethasone (QVAR) 40 MCG/ACT inhaler Inhale 2 puffs into the lungs 2 (two) times daily. (Patient not taking: Reported on 06/21/2016) 1 Inhaler 4  . sertraline (ZOLOFT) 50 MG tablet Take 50 mg by mouth daily.     No facility-administered medications prior to visit.     Allergies  Allergen Reactions  . 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   HENT: Negative.   Eyes: Negative.   Respiratory: Negative.        History of asthma  Cardiovascular: Negative.   Gastrointestinal: Negative.   Genitourinary: Negative.   Musculoskeletal: Negative.   Skin: Negative.   Neurological: Negative.   Endo/Heme/Allergies: Negative.   Psychiatric/Behavioral: Depression: history of depression.   Objective:  BP 95/64 (BP Location: Left Arm, Patient Position: Sitting, Cuff Size: Normal)   Pulse 73   Temp 98.3 F (36.8 C) (Oral)   Resp 18   Ht 5' 2" (1.575 m)   Wt 169 lb (76.7 kg)   LMP 06/14/2016   SpO2 98%   BMI 30.91 kg/m   Physical Exam  Constitutional: She is oriented to person, place, and time. She appears well-developed and well-nourished.  HENT:  Head: Normocephalic and atraumatic.  Right Ear: External ear normal.  Left Ear: External ear normal.  Nose: Nose normal.  Mouth/Throat: Oropharynx is clear and moist.  Eyes: EOM are normal. Pupils are equal, round, and reactive to light.  Neck: Normal range of motion. Neck supple.  Cardiovascular: Normal rate, regular rhythm, normal heart sounds and intact distal pulses.   Pulmonary/Chest: Effort normal and breath sounds normal.  Abdominal: Soft. Bowel sounds are normal.  Musculoskeletal: Normal range of motion.  Neurological: She is alert and oriented to person, place, and time. She has  normal reflexes. She displays a negative Romberg sign.  Skin: Skin is warm and dry.  Psychiatric: She has a normal mood and affect. Her behavior is normal. Thought content normal.  Nursing note and vitals reviewed.  Wt Readings from Last 3 Encounters:  06/21/16 169 lb (76.7 kg)  04/29/16 173 lb (78.5 kg)  05/28/15 173 lb 6.4 oz (78.7 kg) (93 %, Z= 1.49)*   * Growth percentiles are based on CDC 2-20 Years data.   Immunization History  Administered Date(s) Administered  . Influenza,inj,Quad PF,36+ Mos 04/30/2014, 02/19/2015, 02/19/2015, 04/29/2016  . Tdap 04/29/2016   .lab Lab Results  Component Value Date   TSH 1.14 06/21/2016   Recent Results (from the past 2160 hour(s))  HIV antibody (with reflex)     Status: None   Collection Time: 06/21/16 11:28 AM  Result Value Ref Range   HIV 1&2 Ab, 4th Generation NONREACTIVE NONREACTIVE    Comment:   HIV-1 antigen and HIV-1/HIV-2 antibodies were not detected.  There is no laboratory evidence of HIV infection.   HIV-1/2 Antibody Diff        Not indicated. HIV-1 RNA, Qual TMA          Not indicated.     PLEASE NOTE: This information has been disclosed to you from records whose confidentiality may be protected by state law. If your state requires such protection, then the state law prohibits you from making any further disclosure of the information without the specific written consent of the person to whom it pertains, or as otherwise permitted by law. A general authorization for the release of medical or other information is NOT sufficient for this purpose.   The performance of this assay has not been clinically validated in patients less than 59 years old.   For additional information please refer to http://education.[facility].com/faq/FAQ106.  (This link is being provided for informational/educational purposes only.)     Basic Metabolic Panel     Status: None   Collection Time: 06/21/16 11:28 AM  Result Value Ref Range    Sodium 137 135 - 146 mmol/L   Potassium 4.2 3.5 - 5.3 mmol/L   Chloride 105 98 - 110 mmol/L   CO2 27 20 - 31 mmol/L   Glucose, Bld 88 65 - 99 mg/dL   BUN 10 7 - 25 mg/dL   Creat 0.75 0.50 - 1.10 mg/dL   Calcium 9.6 8.6 - 10.2 mg/dL  CBC with Differential     Status: Abnormal   Collection Time: 06/21/16 11:28 AM  Result Value Ref Range   WBC 8.6 3.8 - 10.8 K/uL   RBC 5.08 3.80 - 5.10 MIL/uL   Hemoglobin 13.7 11.7 - 15.5 g/dL   HCT 41.1 35.0 - 45.0 %   MCV 80.9 80.0 - 100.0 fL   MCH 27.0 27.0 - 33.0 pg   MCHC 33.3 32.0 - 36.0 g/dL   RDW 14.1 11.0 - 15.0 %   Platelets 412 (H) 140 - 400 K/uL   MPV  9.7 7.5 - 12.5 fL   Neutro Abs 3,526 1,500 - 7,800 cells/uL   Lymphs Abs 3,784 850 - 3,900 cells/uL   Monocytes Absolute 602 200 - 950 cells/uL   Eosinophils Absolute 688 (H) 15 - 500 cells/uL   Basophils Absolute 0 0 - 200 cells/uL   Neutrophils Relative % 41 %   Lymphocytes Relative 44 %   Monocytes Relative 7 %   Eosinophils Relative 8 %   Basophils Relative 0 %   Smear Review Criteria for review not met   TSH     Status: None   Collection Time: 06/21/16 11:28 AM  Result Value Ref Range   TSH 1.14 mIU/L    Comment:   Reference Range   > or = 20 Years  0.40-4.50   Pregnancy Range First trimester  0.26-2.66 Second trimester 0.55-2.73 Third trimester  0.43-2.91     Vitamin D, 25-hydroxy     Status: Abnormal   Collection Time: 06/21/16 11:28 AM  Result Value Ref Range   Vit D, 25-Hydroxy 12 (L) 30 - 100 ng/mL    Comment: Vitamin D Status           25-OH Vitamin D        Deficiency                <20 ng/mL        Insufficiency         20 - 29 ng/mL        Optimal             > or = 30 ng/mL   For 25-OH Vitamin D testing on patients on D2-supplementation and patients for whom quantitation of D2 and D3 fractions is required, the [facility] 25-OH VIT D, (D2,D3), LC/MS/MS is recommended: order code [id number] (patients > 2 yrs).   Hemoglobin A1c     Status: None    Collection Time: 06/21/16 11:28 AM  Result Value Ref Range   Hgb A1c MFr Bld 4.9 <5.7 %    Comment:   For the purpose of screening for the presence of diabetes:   <5.7%       Consistent with the absence of diabetes 5.7-6.4 %   Consistent with increased risk for diabetes (prediabetes) >=6.5 %     Consistent with diabetes   This assay result is consistent with a decreased risk of diabetes.   Currently, no consensus exists regarding use of hemoglobin A1c for diagnosis of diabetes in children.   According to American Diabetes Association (ADA) guidelines, hemoglobin A1c <7.0% represents optimal control in non-pregnant diabetic patients. Different metrics may apply to specific patient populations. Standards of Medical Care in Diabetes (ADA).      Mean Plasma Glucose 94 mg/dL    Assessment & Plan:   Problem List Items Addressed This Visit      Respiratory   Asthma, mild intermittent   Relevant Medications   albuterol (PROVENTIL HFA;VENTOLIN HFA) 108 (90 Base) MCG/ACT inhaler   beclomethasone (QVAR) 40 MCG/ACT inhaler    Other Visit Diagnoses    Annual physical exam    -  Primary   Relevant Orders   HIV antibody (with reflex) (Completed)   Basic Metabolic Panel (Completed)   CBC with Differential (Completed)   TSH (Completed)   Vitamin D, 25-hydroxy (Completed)   Hemoglobin A1c (Completed)      Meds ordered this encounter  Medications  . albuterol (PROVENTIL HFA;VENTOLIN HFA) 108 (90 Base) MCG/ACT inhaler    Sig:  Inhale 2 puffs into the lungs every 4 (four) hours as needed for wheezing or shortness of breath.    Dispense:  1 Inhaler    Refill:  2  . beclomethasone (QVAR) 40 MCG/ACT inhaler    Sig: Inhale 2 puffs into the lungs 2 (two) times daily.    Dispense:  1 Inhaler    Refill:  2    Return in about 1 year (around 06/21/2017) for Physical.  Fredia Beets, FNP

## 2016-06-21 NOTE — Patient Instructions (Signed)
Asthma Attack Prevention, Adult Although you may not be able to control the fact that you have asthma, you can take actions to prevent episodes of asthma (asthma attacks). These actions include:  Creating a written plan for managing and treating your asthma attacks (asthma action plan).  Monitoring your asthma.  Avoiding things that can irritate your airways or make your asthma symptoms worse (asthma triggers).  Taking your medicines as directed.  Acting quickly if you have signs or symptoms of an asthma attack. What are some ways to prevent an asthma attack? Create a plan Work with your health care provider to create an asthma action plan. This plan should include:  A list of your asthma triggers and how to avoid them.  A list of symptoms that you experience during an asthma attack.  Information about when to take medicine and how much medicine to take.  Information to help you understand your peak flow measurements.  Contact information for your health care providers.  Daily actions that you can take to control asthma. Monitor your asthma   To monitor your asthma:  Use your peak flow meter every morning and every evening for 2-3 weeks. Record the results in a journal. A drop in your peak flow numbers on one or more days may mean that you are starting to have an asthma attack, even if you are not having symptoms.  When you have asthma symptoms, write them down in a journal. Avoid asthma triggers   Work with your health care provider to find out what your asthma triggers are. This can be done by:  Being tested for allergies.  Keeping a journal that notes when asthma attacks occur and what may have contributed to them.  Asking your health care provider whether other medical conditions make your asthma worse. Common asthma triggers include:  Dust.  Smoke. This includes campfire smoke and secondhand smoke from tobacco products.  Pet dander.  Trees, grasses or  pollens.  Very cold, dry, or humid air.  Mold.  Foods that contain high amounts of sulfites.  Strong smells.  Engine exhaust and air pollution.  Aerosol sprays and fumes from household cleaners.  Household pests and their droppings, including dust mites and cockroaches.  Certain medicines, including NSAIDs. Once you have determined your asthma triggers, take steps to avoid them. Depending on your triggers, you may be able to reduce the chance of an asthma attack by:  Keeping your home clean. Have someone dust and vacuum your home for you 1 or 2 times a week. If possible, have them use a high-efficiency particulate arrestance (HEPA) vacuum.  Washing your sheets weekly in hot water.  Using allergy-proof mattress covers and casings on your bed.  Keeping pets out of your home.  Taking care of mold and water problems in your home.  Avoiding areas where people smoke.  Avoiding using strong perfumes or odor sprays.  Avoid spending a lot of time outdoors when pollen counts are high and on very windy days.  Talking with your health care provider before stopping or starting any new medicines. Medicines Take over-the-counter and prescription medicines only as told by your health care provider. Many asthma attacks can be prevented by carefully following your medicine schedule. Taking your medicines correctly is especially important when you cannot avoid certain asthma triggers. Even if you are doing well, do not stop taking your medicine and do not take less medicine. Act quickly If an asthma attack happens, acting quickly can decrease how severe   it is and how long it lasts. Take these actions:  Pay attention to your symptoms. If you are coughing, wheezing, or having difficulty breathing, do not wait to see if your symptoms go away on their own. Follow your asthma action plan.  If you have followed your asthma action plan and your symptoms are not improving, call your health care  provider or seek immediate medical care at the nearest hospital. It is important to write down how often you need to use your fast-acting rescue inhaler. You can track how often you use an inhaler in your journal. If you are using your rescue inhaler more often, it may mean that your asthma is not under control. Adjusting your asthma treatment plan may help you to prevent future asthma attacks and help you to gain better control of your condition. How can I prevent an asthma attack when I exercise?   Exercise is a common asthma trigger. To prevent asthma attacks during exercise:  Follow advice from your health care provider about whether you should use your fast-acting inhaler before exercising. Many people with asthma experience exercise-induced bronchoconstriction (EIB). This condition often worsens during vigorous exercise in cold, humid, or dry environments. Usually, people with EIB can stay very active by using a fast-acting inhaler before exercising.  Avoid exercising outdoors in very cold or humid weather.  Avoid exercising outdoors when pollen counts are high.  Warm up and cool down when exercising.  Stop exercising right away if asthma symptoms start. Consider taking part in exercises that are less likely to cause asthma symptoms such as:  Indoor swimming.  Biking.  Walking.  Hiking.  Playing football. This information is not intended to replace advice given to you by your health care provider. Make sure you discuss any questions you have with your health care provider. Document Released: 05/05/2009 Document Revised: 01/16/2016 Document Reviewed: 11/01/2015 Elsevier Interactive Patient Education  2017 [employer] Inc.  

## 2016-06-22 ENCOUNTER — Encounter: Payer: Self-pay | Admitting: Family Medicine

## 2016-06-22 LAB — VITAMIN D 25 HYDROXY (VIT D DEFICIENCY, FRACTURES): Vit D, 25-Hydroxy: 12 ng/mL — ABNORMAL LOW (ref 30–100)

## 2016-06-24 ENCOUNTER — Other Ambulatory Visit: Payer: Self-pay | Admitting: Family Medicine

## 2016-06-24 ENCOUNTER — Telehealth: Payer: Self-pay

## 2016-06-24 DIAGNOSIS — E559 Vitamin D deficiency, unspecified: Secondary | ICD-10-CM

## 2016-06-24 MED ORDER — VITAMIN D (ERGOCALCIFEROL) 1.25 MG (50000 UNIT) PO CAPS: 50000.0000 [IU] | ORAL_CAPSULE | ORAL | 0 refills | Status: AC

## 2016-06-24 NOTE — Telephone Encounter (Signed)
CMA call to go over lab results no answer left a VM stating the reason of the call and to call us back

## 2016-06-24 NOTE — Telephone Encounter (Signed)
-----   Message from Lizbeth BarkMandesia R Hairston, FNP sent at 06/24/2016  8:35 AM EST ----- Vitamin D level was low. Vitamin D helps to keep bones strong. You were prescribed ergocalciferol (capsules) to increase your vitamin-d level. Once finished start taking OTC vitamin d supplement with 800 international units (IU) of vitamin-d per day. Recheck vitamin d lab in 4 months. HIV negative Hgba1c is normal you do not have diabetes Thyroid function normal  Labs normal

## 2016-08-15 ENCOUNTER — Encounter (HOSPITAL_COMMUNITY): Payer: Self-pay

## 2016-08-15 ENCOUNTER — Ambulatory Visit (HOSPITAL_COMMUNITY)
Admission: EM | Admit: 2016-08-15 | Discharge: 2016-08-15 | Disposition: A | Discharge status: Home / Self Care | Payer: Self-pay | Attending: Radiology | Admitting: Radiology

## 2016-08-15 DIAGNOSIS — Z79899 Other long term (current) drug therapy: Secondary | ICD-10-CM | POA: Insufficient documentation

## 2016-08-15 DIAGNOSIS — J029 Acute pharyngitis, unspecified: Secondary | ICD-10-CM | POA: Insufficient documentation

## 2016-08-15 DIAGNOSIS — H9202 Otalgia, left ear: Secondary | ICD-10-CM | POA: Insufficient documentation

## 2016-08-15 LAB — POCT RAPID STREP A: STREPTOCOCCUS, GROUP A SCREEN (DIRECT): NEGATIVE

## 2016-08-15 MED ORDER — PREDNISONE 10 MG PO TABS: 40.0000 mg | ORAL_TABLET | Freq: Every day | ORAL | 0 refills | Status: AC

## 2016-08-15 MED ORDER — FLUTICASONE PROPIONATE 50 MCG/ACT NA SUSP: 2.0000 | Freq: Every day | NASAL | 0 refills | Status: DC

## 2016-08-15 NOTE — ED Notes (Signed)
Patient discharged by provider.

## 2016-08-15 NOTE — ED Triage Notes (Signed)
[facility] of [facility] since February she has been unable to see primary physician due to insurance coverage, patient noticed a black spot in back of throat on left side this morning and became concerned and would like to be check out

## 2016-08-15 NOTE — ED Provider Notes (Signed)
CSN: [id number]     Arrival date & time 08/15/16  1449 History   None    Chief Complaint  Patient presents with  . Sore Throat   (Consider location/radiation/quality/duration/timing/severity/associated sxs/prior Treatment) 21 y.o. female presents with sore throat.  Patient states that she originally had pain to her left ear that began in February and that the pain now radiates down to left side of her throat. Patient denies any fevers, headache or nasal discharge. Condition is acute in nature. Condition is made better by nothing. Condition is made worse by eating, swallowing and talking. Patient denies any treatment prior to there arrival at this facility.        Past Medical History:  Diagnosis Date  . Anemia   . Anxiety   . Depression   . OCD (obsessive compulsive disorder)    History reviewed. No pertinent surgical history. Family History  Problem Relation Age of Onset  . Asthma Father   . Hyperlipidemia Father   . Hypertension Father   . Diabetes Father   . Hyperlipidemia Paternal Aunt   . Cancer Paternal Uncle   . Diabetes Paternal Uncle   . Hyperlipidemia Paternal Grandmother    Social History  Substance Use Topics  . Smoking status: Never Smoker  . Smokeless tobacco: Never Used  . Alcohol use No   OB History    Gravida Para Term Preterm AB Living   0 0 0 0 0 0   SAB TAB Ectopic Multiple Live Births   0 0 0 0       Review of Systems  Constitutional: Negative for chills and fever.  HENT: Positive for ear pain (left ear) and sore throat ( left).   Eyes: Negative for pain and visual disturbance.  Respiratory: Negative for cough and shortness of breath.   Cardiovascular: Negative for chest pain and palpitations.  Gastrointestinal: Negative for abdominal pain and vomiting.  Genitourinary: Negative for dysuria and hematuria.  Musculoskeletal: Negative for arthralgias and back pain.  Skin: Negative for color change and rash.  Neurological: Negative for seizures  and syncope.  All other systems reviewed and are negative.   Allergies  Hazel tree pollen [corylus]  Home Medications   Prior to Admission medications   Medication Sig Start Date End Date Taking? Authorizing Provider  ibuprofen (ADVIL,MOTRIN) 600 MG tablet Take 1 tablet (600 mg total) by mouth every 8 (eight) hours as needed (Take with food.). 04/29/16  Yes Lizbeth BarkMandesia R Hairston, FNP  norgestimate-ethinyl estradiol (ORTHO-CYCLEN,SPRINTEC,PREVIFEM) 0.25-35 MG-MCG tablet Take 1 tablet by mouth daily. 10/17/15  Yes Adam PhenixJames G Arnold, MD  norgestimate-ethinyl estradiol (ORTHO-CYCLEN,SPRINTEC,PREVIFEM) 0.25-35 MG-MCG tablet Take 1 tablet by mouth daily. 10/22/15  Yes Adam PhenixJames G Arnold, MD  albuterol (PROVENTIL HFA;VENTOLIN HFA) 108 (90 Base) MCG/ACT inhaler Inhale 2 puffs into the lungs every 4 (four) hours as needed for wheezing or shortness of breath. 06/21/16   Lizbeth BarkMandesia R Hairston, FNP  beclomethasone (QVAR) 40 MCG/ACT inhaler Inhale 2 puffs into the lungs 2 (two) times daily. 06/21/16   Lizbeth BarkMandesia R Hairston, FNP  fluticasone (FLONASE) 50 MCG/ACT nasal spray Place 2 sprays into both nostrils daily. 08/15/16   Alene MiresJennifer C Azile Minardi, NP  predniSONE (DELTASONE) 10 MG tablet Take 4 tablets (40 mg total) by mouth daily. 08/15/16 08/20/16  Alene MiresJennifer C Kristyanna Barcelo, NP   Meds Ordered and Administered this Visit  Medications - No data to display  BP 118/75 (BP Location: Right Arm)   Pulse 100   Temp 99.9 F (37.7 C) (Oral)  Resp 17   LMP 08/04/2016 (Approximate)   SpO2 100%  No data found.   Physical Exam  Constitutional: She is oriented to person, place, and time. She appears well-developed and well-nourished.  HENT:  Head: Normocephalic and atraumatic.  Effusion noted to left ear, erythema with exudate noted to left tonsil.   Eyes: Conjunctivae are normal.  Neck: Normal range of motion.  Cardiovascular: Normal rate and regular rhythm.   Pulmonary/Chest: Effort normal and breath sounds normal.   Neurological: She is alert and oriented to person, place, and time.  Psychiatric: She has a normal mood and affect.  Nursing note and vitals reviewed.   Urgent Care Course     Procedures (including critical care time)  Labs Review Labs Reviewed  POCT RAPID STREP A  Specimen will be sent off for culture.    Imaging Review No results found.     MDM   1. Pharyngitis, unspecified etiology        Alene Mires, NP 08/15/16 1625

## 2016-08-18 LAB — CULTURE, GROUP A STREP (THRC)

## 2016-08-21 ENCOUNTER — Telehealth (HOSPITAL_COMMUNITY): Payer: Self-pay | Admitting: Emergency Medicine

## 2016-08-21 NOTE — Telephone Encounter (Signed)
Returned pt's call but VM has not been set up and unable to LM  Pt needed throat culture which was negative

## 2016-08-22 NOTE — Telephone Encounter (Signed)
Pt called back... Notified of lab results (throat culture) from visit on 3/18  Sts. She's feeling better.   Pt sts she was able to view lab results on MyChart

## 2016-10-14 ENCOUNTER — Encounter: Payer: Self-pay | Admitting: Obstetrics and Gynecology

## 2016-11-18 ENCOUNTER — Ambulatory Visit: Payer: BLUE CROSS/BLUE SHIELD | Admitting: Obstetrics and Gynecology

## 2016-12-20 ENCOUNTER — Ambulatory Visit: Payer: BLUE CROSS/BLUE SHIELD | Admitting: Obstetrics and Gynecology

## 2016-12-20 ENCOUNTER — Encounter: Payer: Self-pay | Admitting: General Practice

## 2016-12-20 NOTE — Progress Notes (Unsigned)
Patient no showed for appt. Can reschedule on her own per Dr Vergie LivingPickens

## 2017-10-06 ENCOUNTER — Ambulatory Visit (INDEPENDENT_AMBULATORY_CARE_PROVIDER_SITE_OTHER): Payer: BLUE CROSS/BLUE SHIELD | Admitting: Pharmacist Clinician (PhC)/ Clinical Pharmacy Specialist

## 2017-10-06 ENCOUNTER — Other Ambulatory Visit (HOSPITAL_COMMUNITY)
Admission: RE | Admit: 2017-10-06 | Discharge: 2017-10-06 | Disposition: A | Discharge status: Home / Self Care | Payer: BLUE CROSS/BLUE SHIELD | Source: Ambulatory Visit | Attending: Infectious Disease | Admitting: Infectious Disease

## 2017-10-06 DIAGNOSIS — Z7251 High risk heterosexual behavior: Secondary | ICD-10-CM | POA: Diagnosis not present

## 2017-10-06 NOTE — Progress Notes (Signed)
Date:  10/06/2017   Insured      Uninsured     HPI  Lariah L Kathleene Hazel is a 22 y.o. female who is here for her initial PrEP visit.   Demographics Race:  Other or two or more races [6] Marital Status:  Single  Allergies Allergies  Allergen Reactions  . Hazel Tree Pollen [Corylus] Anaphylaxis    Hazelnuts     Past Medical History:  Diagnosis Date  . Anemia   . Anxiety   . Depression   . OCD (obsessive compulsive disorder)     Outpatient Encounter Medications as of 10/06/2017  Medication Sig  . albuterol (PROVENTIL HFA;VENTOLIN HFA) 108 (90 Base) MCG/ACT inhaler Inhale 2 puffs into the lungs every 4 (four) hours as needed for wheezing or shortness of breath.  . norelgestromin-ethinyl estradiol (ORTHO EVRA) 150-35 MCG/24HR transdermal patch Place 1 patch onto the skin once a week.  . valACYclovir (VALTREX) 500 MG tablet Take 500 mg by mouth daily.  Marland Kitchen ibuprofen (ADVIL,MOTRIN) 600 MG tablet Take 1 tablet (600 mg total) by mouth every 8 (eight) hours as needed (Take with food.). (Patient not taking: Reported on 10/06/2017)  . [DISCONTINUED] beclomethasone (QVAR) 40 MCG/ACT inhaler Inhale 2 puffs into the lungs 2 (two) times daily.  . [DISCONTINUED] fluticasone (FLONASE) 50 MCG/ACT nasal spray Place 2 sprays into both nostrils daily.  . [DISCONTINUED] norgestimate-ethinyl estradiol (ORTHO-CYCLEN,SPRINTEC,PREVIFEM) 0.25-35 MG-MCG tablet Take 1 tablet by mouth daily.  . [DISCONTINUED] norgestimate-ethinyl estradiol (ORTHO-CYCLEN,SPRINTEC,PREVIFEM) 0.25-35 MG-MCG tablet Take 1 tablet by mouth daily.   No facility-administered encounter medications on file as of 10/06/2017.     Social History   Tobacco Use  Smoking Status Never Smoker  Smokeless Tobacco Never Used   Social History   Substance and Sexual Activity  Alcohol Use No  . Alcohol/week: 0.0 oz    Drug use?   Yes  No   Injectable drug use?   Yes     No   Sexual History  Missing doses? Yes    No     CHL HIV PREP FLOWSHEET RESULTS 10/06/2017  Insurance Status Insured  How did you hear? Friend, boyfriend is infect  Gender at birth Female  Gender identity cis-Female  Risk for HIV In sexual relationship with HIV+ partner;Condomless vaginal or anal intercourse;Hx of STI  Sex Partners Men only  # sex partners past 3-6 mos 1-3  Sex activity preferences Receptive;Oral  Condom use Yes  % condom use 26  Partners genders and ages M 20-24  Treated for STI? Yes  HIV symptoms? N/A  PrEP Eligibility HIV negative;Substantial risk for HIV  Preg status No  Breastfeeding? N/A  Paper work received? No    Labs: Creatinine Lab Results  Component Value Date   CREATININE 0.75 06/21/2016    HIV Lab Results  Component Value Date   HIV NONREACTIVE 06/21/2016    GFR CrCl cannot be calculated (Patient's most recent lab result is older than the maximum 21 days allowed.).  Hepatitis B No results found for: HEPBSAB No results found for: HEPBSAG  Hepatitis C No results found for: HCVAB  Hepatitis A No results found for: HAV  RPR and STI No results found for: LABRPR, RPRTITER  No flowsheet data found.     Assessment  Robyne is a 22 yo female who is here to see pharmacy for PrEP. 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 She was referred here by Dr. Langston Masker office. She is  in a relationship with a perinatally infected HIV patient. Her partner will be seen here soon. She stated that he is on antiretroviral therapy. The referral office did do a HIV test on 4/25 and it was neg. Since it's older than 7 days, we will repeat it today along with all of her other PrEP labs. If she is neg for hep A/B titers, we will give vaccines at the next appt. Her condom use is about 50% at the moment. Advised her to use condoms to decrease the risk of STDs and HIV right now. Counseled on the importance of adherence and side effects.   She has BCBS of [place] so [facility] probably can't fill it. It  may have to be filled at a specialty pharmacy. We will try [facility].   Recommendations   Baseline PrEP labs If neg, 1 mo of Truvada F/u in 1 month  Ulyses Southward, PharmD, BCPS, AAHIVP, CPP Infectious Disease Pharmacist Pager: [phone] 10/06/2017 12:19 PM

## 2017-10-07 ENCOUNTER — Telehealth: Payer: Self-pay | Admitting: Pharmacist Clinician (PhC)/ Clinical Pharmacy Specialist

## 2017-10-07 LAB — CYTOLOGY, (ORAL, ANAL, URETHRAL) ANCILLARY ONLY
CHLAMYDIA, DNA PROBE: NEGATIVE
Chlamydia: NEGATIVE
NEISSERIA GONORRHEA: NEGATIVE
Neisseria Gonorrhea: NEGATIVE

## 2017-10-07 LAB — BASIC METABOLIC PANEL
BUN: 11 mg/dL (ref 7–25)
CO2: 24 mmol/L (ref 20–32)
Calcium: 8.9 mg/dL (ref 8.6–10.2)
Chloride: 104 mmol/L (ref 98–110)
Creat: 0.76 mg/dL (ref 0.50–1.10)
Glucose, Bld: 91 mg/dL (ref 65–99)
POTASSIUM: 4.2 mmol/L (ref 3.5–5.3)
SODIUM: 136 mmol/L (ref 135–146)

## 2017-10-07 LAB — HEPATITIS A ANTIBODY, TOTAL: HEPATITIS A AB,TOTAL: REACTIVE — AB

## 2017-10-07 LAB — HEPATITIS B SURFACE ANTIBODY,QUALITATIVE: HEP B S AB: REACTIVE — AB

## 2017-10-07 LAB — RPR: RPR Ser Ql: NONREACTIVE

## 2017-10-07 LAB — URINE CYTOLOGY ANCILLARY ONLY
CHLAMYDIA, DNA PROBE: NEGATIVE
NEISSERIA GONORRHEA: NEGATIVE

## 2017-10-07 LAB — HIV ANTIBODY (ROUTINE TESTING W REFLEX): HIV: NONREACTIVE

## 2017-10-07 LAB — HEPATITIS B SURFACE ANTIGEN: HEP B S AG: NONREACTIVE

## 2017-10-07 LAB — HEPATITIS C ANTIBODY
Hepatitis C Ab: NONREACTIVE
SIGNAL TO CUT-OFF: 0.03 (ref <1.00)

## 2017-10-07 MED ORDER — EMTRICITABINE-TENOFOVIR DF 200-300 MG PO TABS: 1.0000 | ORAL_TABLET | Freq: Every day | ORAL | 0 refills | Status: DC

## 2017-10-07 NOTE — Telephone Encounter (Signed)
HIV ab neg so 30d of Truvada. Left her a VM that the Rx has been sent to [facility] pharmacy. Copay card activated.   ID [id number] BIN [id number] GRP [id number] PCN LOYALTY

## 2017-10-10 MED FILL — TRUVADA 200-300 MG TABS: 200-300 | 30 days supply | Qty: 30 | Fill #0

## 2017-10-11 ENCOUNTER — Other Ambulatory Visit: Payer: BLUE CROSS/BLUE SHIELD

## 2017-10-11 LAB — POCT URINE PREGNANCY: PREG TEST UR: NEGATIVE

## 2017-11-02 ENCOUNTER — Ambulatory Visit (INDEPENDENT_AMBULATORY_CARE_PROVIDER_SITE_OTHER): Payer: BLUE CROSS/BLUE SHIELD | Admitting: Pharmacist Clinician (PhC)/ Clinical Pharmacy Specialist

## 2017-11-02 DIAGNOSIS — Z7251 High risk heterosexual behavior: Secondary | ICD-10-CM | POA: Diagnosis not present

## 2017-11-02 NOTE — Progress Notes (Addendum)
Date:  11/02/2017   Insured   [x]    Uninsured  []    HPI  Catherine Lane is a 22 y.o. female who presents to clinic today for her one month PREP follow-up.   Demographics Race:  Other or two or more races [6] Marital Status:  Single  Allergies: Anaphylaxis to Hazelnuts   Past Medical History:  Diagnosis Date  . Anemia   . Anxiety   . Depression   . OCD (obsessive compulsive disorder)    Outpatient Encounter Medications as of 11/02/2017  Medication Sig  . albuterol (PROVENTIL HFA;VENTOLIN HFA) 108 (90 Base) MCG/ACT inhaler Inhale 2 puffs into the lungs every 4 (four) hours as needed for wheezing or shortness of breath.  Marland Kitchen. emtricitabine-tenofovir (TRUVADA) 200-300 MG tablet Take 1 tablet by mouth daily.  . norelgestromin-ethinyl estradiol (ORTHO EVRA) 150-35 MCG/24HR transdermal patch Place 1 patch onto the skin once a week.  . valACYclovir (VALTREX) 500 MG tablet Take 500 mg by mouth daily.  Marland Kitchen. ibuprofen (ADVIL,MOTRIN) 600 MG tablet Take 1 tablet (600 mg total) by mouth every 8 (eight) hours as needed (Take with food.). (Patient not taking: Reported on 10/06/2017)   No facility-administered encounter medications on file as of 11/02/2017.    Social History   Tobacco Use  Smoking Status Never Smoker  Smokeless Tobacco Never Used   Social History   Substance and Sexual Activity  Alcohol Use No  . Alcohol/week: 0.0 oz   Drug use?   Yes []  No [x]   Injectable drug use?   Yes []     No [x]   Sexual History  Missing doses? Yes [x]    No  []  Missed one dose 6/4  CHL HIV PREP FLOWSHEET RESULTS 11/02/2017 10/06/2017  Insurance Status Insured Insured  How did you hear? Friend, boyfriend  Friend, boyfriend is infect  Gender at birth Female Female  Gender identity cis-Female cis-Female  Risk for HIV In sexual relationship with HIV+ partner;Condomless vaginal or anal intercourse In sexual relationship with HIV+ partner;Condomless vaginal or anal intercourse;Hx of STI  Sex Partners  Men only Men only  # sex partners past 3-6 mos 1-3 1-3  Sex activity preferences Receptive;Oral Receptive;Oral  Condom use No Yes  % condom use 0 5050  Partners genders and ages M 20-24 M 20-24  Treated for STI? No Yes  HIV symptoms? - N/A  PrEP Eligibility Substantial risk for HIV;CrCl >60 ml/min;HIV negative HIV negative;Substantial risk for HIV  Preg status No No  Breastfeeding? N/A N/A  Paper work received? - No   Labs: Creatinine Lab Results  Component Value Date   CREATININE 0.76 10/06/2017   CREATININE 0.75 06/21/2016   HIV Lab Results  Component Value Date   HIV NON-REACTIVE 10/06/2017   HIV NONREACTIVE 06/21/2016   GFR CrCl cannot be calculated (Patient's most recent lab result is older than the maximum 21 days allowed.).  Hepatitis B Lab Results  Component Value Date   HEPBSAB REACTIVE (A) 10/06/2017   Lab Results  Component Value Date   HEPBSAG NON-REACTIVE 10/06/2017   Hepatitis C No results found for: HCVAB  Hepatitis A Lab Results  Component Value Date   HAV REACTIVE (A) 10/06/2017   RPR and STI Lab Results  Component Value Date   LABRPR NON-REACTIVE 10/06/2017   STI Results GC CT  10/06/2017 Negative Negative  10/06/2017 Negative Negative  10/06/2017 Negative Negative   Assessment  Catherine Lane is doing well on her PREP therapy. She was referred by her OBGYN,  Dr. Langston Masker. She remains in a relationship with a perinatally infected HIV patient, which she reports is suppressed. Her condom use is at 0% at the moment. She was counseled that Truvada will reduce her risk of contracting HIV but not other STDs. She reports no HIV related symptoms, no sore throat, enlarged lymph nodes, or fatigue. She reports one missed dose 6/4 due to a change in her work schedule and forgetting to take it early in the morning. She was counseled on the importance of adherence and reports no side effects to therapy.   Recommendations   HIV testing today, If negative, will renew 3  months of Truvada F/u in 3 months   Catherine Rader L. Marcy Salvo, PharmD, MS PGY1 Pharmacy Resident  Agreed with Doniqua Saxby's note. She works at [employer] center now instead of [employer]. She is here for a adherence check and Bmet.   Ulyses Southward, PharmD, BCPS, AAHIVP, CPP Infectious Disease Pharmacist Pager: [phone] 11/02/2017 10:30 AM

## 2017-11-03 ENCOUNTER — Telehealth: Payer: Self-pay

## 2017-11-03 ENCOUNTER — Telehealth: Payer: Self-pay | Admitting: Pharmacist Clinician (PhC)/ Clinical Pharmacy Specialist

## 2017-11-03 LAB — BASIC METABOLIC PANEL
BUN: 8 mg/dL (ref 7–25)
CO2: 25 mmol/L (ref 20–32)
Calcium: 9.1 mg/dL (ref 8.6–10.2)
Chloride: 104 mmol/L (ref 98–110)
Creat: 0.75 mg/dL (ref 0.50–1.10)
GLUCOSE: 89 mg/dL (ref 65–99)
POTASSIUM: 4.4 mmol/L (ref 3.5–5.3)
SODIUM: 138 mmol/L (ref 135–146)

## 2017-11-03 LAB — HIV ANTIBODY (ROUTINE TESTING W REFLEX): HIV 1&2 Ab, 4th Generation: NONREACTIVE

## 2017-11-03 MED ORDER — EMTRICITABINE-TENOFOVIR DF 200-300 MG PO TABS: 1.0000 | ORAL_TABLET | Freq: Every day | ORAL | 2 refills | Status: DC

## 2017-11-03 NOTE — Telephone Encounter (Signed)
HIV ab is neg so 3 more months of TRV. Mailbox is full so couldn't leave a VM.

## 2017-11-04 ENCOUNTER — Telehealth: Payer: Self-pay | Admitting: Pharmacist Clinician (PhC)/ Clinical Pharmacy Specialist

## 2017-11-04 MED FILL — TRUVADA 200-300 MG TABS: 200-300 | 30 days supply | Qty: 30 | Fill #0

## 2017-11-04 NOTE — Telephone Encounter (Signed)
Catherine Lane has lost her previous insurance with [employer]. We will use the patient assistance to cover her 30d supply of medication for now. She will bring in the pay stubs and proof of address next week to get the extension until her insurance kicks in.

## 2017-11-18 ENCOUNTER — Ambulatory Visit (HOSPITAL_COMMUNITY)
Admission: EM | Admit: 2017-11-18 | Discharge: 2017-11-18 | Disposition: A | Discharge status: Home / Self Care | Payer: BLUE CROSS/BLUE SHIELD | Attending: Family Medicine | Admitting: Family Medicine

## 2017-11-18 ENCOUNTER — Encounter (HOSPITAL_COMMUNITY): Payer: Self-pay

## 2017-11-18 DIAGNOSIS — R112 Nausea with vomiting, unspecified: Secondary | ICD-10-CM

## 2017-11-18 DIAGNOSIS — J019 Acute sinusitis, unspecified: Secondary | ICD-10-CM

## 2017-11-18 DIAGNOSIS — B9689 Other specified bacterial agents as the cause of diseases classified elsewhere: Secondary | ICD-10-CM

## 2017-11-18 LAB — POCT PREGNANCY, URINE: PREG TEST UR: NEGATIVE

## 2017-11-18 MED ORDER — AMOXICILLIN 875 MG PO TABS: 875.0000 mg | ORAL_TABLET | Freq: Two times a day (BID) | ORAL | 0 refills | Status: DC

## 2017-11-18 NOTE — Discharge Instructions (Addendum)
Take ranitidine two times a day for the stomach discomfort Take the amoxicillin as prescribed for one week for respiratory infection Consider a saline nasal spray May use OTC cough and cold medicine Return as needed

## 2017-11-18 NOTE — ED Provider Notes (Signed)
[facility]    CSN: [id number] Arrival date & time: 11/18/17  1526     History   Chief Complaint Chief Complaint  Patient presents with  . URI  . Abdominal Pain    HPI Catherine Lane is a 22 y.o. female.   HPI  Cough cold runny nose and sinus pressure for about 2 weeks.  She still has a lot of sinus pressure and pain, postnasal drip, coughing.  She is developed yellow sputum.  She is feeling very tired.  She is tried over-the-counter medications.  Is not going away.  She has pressure and pain in her ears.  Her throat is sore from the drainage.  She had some abdominal discomfort.  Annoying feeling.  She states she is hungry all the time.  She has had unprotected sex.  She does not think she is pregnant but is not certain.  Pregnancy test is done and is negative.  Past Medical History:  Diagnosis Date  . Anemia   . Anxiety   . Depression   . OCD (obsessive compulsive disorder)     Patient Active Problem List   Diagnosis Date Noted  . Headache 02/10/2015  . Asthma, mild intermittent 07/30/2014  . Irregular periods 07/30/2014    History reviewed. No pertinent surgical history.  OB History    Gravida  0   Para  0   Term  0   Preterm  0   AB  0   Living  0     SAB  0   TAB  0   Ectopic  0   Multiple  0   Live Births               Home Medications    Prior to Admission medications   Medication Sig Start Date End Date Taking? Authorizing Provider  albuterol (PROVENTIL HFA;VENTOLIN HFA) 108 (90 Base) MCG/ACT inhaler Inhale 2 puffs into the lungs every 4 (four) hours as needed for wheezing or shortness of breath. 06/21/16  Yes Hairston, Mandesia R, FNP  emtricitabine-tenofovir (TRUVADA) 200-300 MG tablet Take 1 tablet by mouth daily. 11/03/17  Yes Judyann Munson, MD  norelgestromin-ethinyl estradiol (ORTHO EVRA) 150-35 MCG/24HR transdermal patch Place 1 patch onto the skin once a week.   Yes [provider]  valACYclovir  (VALTREX) 500 MG tablet Take 500 mg by mouth daily.   Yes [provider]  amoxicillin (AMOXIL) 875 MG tablet Take 1 tablet (875 mg total) by mouth 2 (two) times daily. 11/18/17   Eustace Moore, MD  ibuprofen (ADVIL,MOTRIN) 600 MG tablet Take 1 tablet (600 mg total) by mouth every 8 (eight) hours as needed (Take with food.). Patient not taking: Reported on 10/06/2017 04/29/16   Lizbeth Bark, FNP    Family History Family History  Problem Relation Age of Onset  . Asthma Father   . Hyperlipidemia Father   . Hypertension Father   . Diabetes Father   . Hyperlipidemia Paternal Aunt   . Cancer Paternal Uncle   . Diabetes Paternal Uncle   . Hyperlipidemia Paternal Grandmother     Social History Social History   Tobacco Use  . Smoking status: Never Smoker  . Smokeless tobacco: Never Used  Substance Use Topics  . Alcohol use: No    Alcohol/week: 0.0 oz  . Drug use: No     Allergies   Lane tree pollen [corylus]   Review of Systems Review of Systems  Constitutional: Positive for fatigue.  Negative for chills and fever.  HENT: Positive for congestion, postnasal drip, sinus pressure and sinus pain. Negative for ear pain, rhinorrhea and sore throat.   Eyes: Negative for pain and visual disturbance.  Respiratory: Positive for cough. Negative for shortness of breath.   Cardiovascular: Negative for chest pain and palpitations.  Gastrointestinal: Negative for abdominal pain and vomiting.  Genitourinary: Negative for dysuria and hematuria.  Musculoskeletal: Negative for arthralgias and back pain.  Skin: Negative for color change and rash.  Neurological: Negative for seizures and syncope.  All other systems reviewed and are negative.    Physical Exam Triage Vital Signs ED Triage Vitals  Enc Vitals Group     BP 11/18/17 1544 123/82     Pulse Rate 11/18/17 1544 91     Resp 11/18/17 1544 19     Temp 11/18/17 1544 98 F (36.7 C)     Temp src --      SpO2  11/18/17 1544 99 %     Weight --      Height --      Head Circumference --      Peak Flow --      Pain Score 11/18/17 1545 5     Pain Loc --      Pain Edu? --      Excl. in GC? --    No data found.  Updated Vital Signs BP 123/82   Pulse 91   Temp 98 F (36.7 C)   Resp 19   LMP 10/22/2017   SpO2 99%       Physical Exam  Constitutional: She appears well-developed and well-nourished. No distress.  HENT:  Head: Normocephalic and atraumatic.  Mouth/Throat: Oropharynx is clear and moist. No oropharyngeal exudate.  Sinuses are tender.  Posterior pharynx is erythematous.  Eyes: Pupils are equal, round, and reactive to light. Conjunctivae are normal.  Neck: Normal range of motion.  Cardiovascular: Normal rate, regular rhythm and normal heart sounds.  Pulmonary/Chest: Effort normal and breath sounds normal. No respiratory distress.  Abdominal: Soft. Normal appearance and bowel sounds are normal. She exhibits no distension. There is tenderness in the epigastric area. There is no rebound and no guarding.  Mild epigastric tenderness  Musculoskeletal: Normal range of motion. She exhibits no edema.  Neurological: She is alert.  Skin: Skin is warm and dry.     UC Treatments / Results  Labs (all labs ordered are listed, but only abnormal results are displayed) Labs Reviewed  POCT PREGNANCY, URINE    EKG None  Radiology No results found.  Procedures Procedures (including critical care time)  Medications Ordered in UC Medications - No data to display  Initial Impression / Assessment and Plan / UC Course  I have reviewed the triage vital signs and the nursing notes.  Pertinent labs & imaging results that were available during my care of the patient were reviewed by me and considered in my medical decision making (see chart for details).      Final Clinical Impressions(s) / UC Diagnoses   Final diagnoses:  Acute bacterial sinusitis  Nausea and vomiting,  intractability of vomiting not specified, unspecified vomiting type     Discharge Instructions     Take ranitidine two times a day for the stomach discomfort Take the amoxicillin as prescribed for one week for respiratory infection Consider a saline nasal spray May use OTC cough and cold medicine Return as needed   ED Prescriptions    Medication Sig Dispense Auth. Provider  amoxicillin (AMOXIL) 875 MG tablet Take 1 tablet (875 mg total) by mouth 2 (two) times daily. 14 tablet Eustace MooreNelson, Josep Luviano Sue, MD     Controlled Substance Prescriptions [place] Controlled Substance Registry consulted? Not Applicable   Eustace MooreNelson, Sloan Takagi Sue, MD 11/18/17 2054

## 2017-11-18 NOTE — ED Triage Notes (Signed)
Pt presents with cough and runny nose and stuffy ears x 2 weeks. Pt also reports abdominal pain and one episode of blood in stool x 1 week ago.

## 2018-01-31 ENCOUNTER — Other Ambulatory Visit: Payer: Self-pay | Admitting: Pharmacist

## 2018-01-31 ENCOUNTER — Ambulatory Visit: Payer: Self-pay | Admitting: Pharmacist

## 2018-01-31 ENCOUNTER — Ambulatory Visit (INDEPENDENT_AMBULATORY_CARE_PROVIDER_SITE_OTHER): Payer: Self-pay | Admitting: Pharmacist

## 2018-01-31 DIAGNOSIS — Z7251 High risk heterosexual behavior: Secondary | ICD-10-CM

## 2018-01-31 LAB — POCT URINE PREGNANCY: Preg Test, Ur: NEGATIVE

## 2018-01-31 NOTE — Progress Notes (Deleted)
Date:  01/31/2018   HPI: Catherine Lane is a 22 y.o. female here for her 3 month PrEP follow up visit.  Insured   []    Uninsured  [x]  Previously insured, then in between jobs. 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  Patient Active Problem List   Diagnosis Date Noted  . Headache 02/10/2015  . Asthma, mild intermittent 07/30/2014  . Irregular periods 07/30/2014    Patient's Medications  New Prescriptions   No medications on file  Previous Medications   ALBUTEROL (PROVENTIL HFA;VENTOLIN HFA) 108 (90 BASE) MCG/ACT INHALER    Inhale 2 puffs into the lungs every 4 (four) hours as needed for wheezing or shortness of breath.   AMOXICILLIN (AMOXIL) 875 MG TABLET    Take 1 tablet (875 mg total) by mouth 2 (two) times daily.   EMTRICITABINE-TENOFOVIR (TRUVADA) 200-300 MG TABLET    Take 1 tablet by mouth daily.   IBUPROFEN (ADVIL,MOTRIN) 600 MG TABLET    Take 1 tablet (600 mg total) by mouth every 8 (eight) hours as needed (Take with food.).   NORELGESTROMIN-ETHINYL ESTRADIOL (ORTHO EVRA) 150-35 MCG/24HR TRANSDERMAL PATCH    Place 1 patch onto the skin once a week.   VALACYCLOVIR (VALTREX) 500 MG TABLET    Take 500 mg by mouth daily.  Modified Medications   No medications on file  Discontinued Medications   No medications on file    Allergies: Allergies  Allergen Reactions  . Lane Tree Pollen [Corylus] Anaphylaxis    *** Hazelnuts     Past Medical History: Past Medical History:  Diagnosis Date  . Anemia   . Anxiety   . Depression   . OCD (obsessive compulsive disorder)     Social History: Social History   Socioeconomic History  . Marital status: Single    Spouse name: Not on file  . Number of children: Not on file  . Years of education: Not on file  . Highest education level: Not on file  Occupational History  . Not on file  Social Needs  . Financial resource strain: Not on file  . Food insecurity:    Worry: Not on file    Inability:  Not on file  . Transportation needs:    Medical: Not on file    Non-medical: Not on file  Tobacco Use  . Smoking status: Never Smoker  . Smokeless tobacco: Never Used  Substance and Sexual Activity  . Alcohol use: No    Alcohol/week: 0.0 standard drinks  . Drug use: No  . Sexual activity: Never    Birth control/protection: None  Lifestyle  . Physical activity:    Days per week: Not on file    Minutes per session: Not on file  . Stress: Not on file  Relationships  . Social connections:    Talks on phone: Not on file    Gets together: Not on file    Attends religious service: Not on file    Active member of club or organization: Not on file    Attends meetings of clubs or organizations: Not on file    Relationship status: Not on file  Other Topics Concern  . Not on file  Social History Narrative  . Not on file    [facility] HIV PREP FLOWSHEET RESULTS 01/31/2018 01/31/2018 11/02/2017 10/06/2017  Insurance Status Insured Uninsured Insured Insured  How did you hear? - a referral from my doctor Friend, boyfriend  Friend, boyfriend is infect  Gender at birth Female Female Female Female  Gender identity cis-Female cis-Female cis-Female cis-Female  Risk for HIV In sexual relationship with HIV+ partner;Condomless vaginal or anal intercourse In sexual relationship with HIV+ partner;Condomless vaginal or anal intercourse In sexual relationship with HIV+ partner;Condomless vaginal or anal intercourse In sexual relationship with HIV+ partner;Condomless vaginal or anal intercourse;Hx of STI  Sex Partners Men only Women only Men only Men only  # sex partners past 3-6 mos 1-3 1-3 1-3 1-3  Sex activity preferences Receptive;Oral Receptive;Oral Receptive;Oral Receptive;Oral  Condom use No No No Yes  % condom use - - 0 50  Partners genders and ages - M 20-24 M 20-24 M 20-24  Treated for STI? No No No Yes  HIV symptoms? Lane/A Lane/A - Lane/A  PrEP Eligibility HIV negative;Substantial risk for HIV;CrCl >60 ml/min  HIV negative;Past treatment for potential exposure to HIV from sex or injectable drugs Substantial risk for HIV;CrCl >60 ml/min;HIV negative HIV negative;Substantial risk for HIV  Preg status No No No No  Breastfeeding? No No Lane/A Lane/A  Paper work received? - No - No    Labs:  SCr: Lab Results  Component Value Date   CREATININE 0.75 11/02/2017   CREATININE 0.76 10/06/2017   CREATININE 0.75 06/21/2016   HIV Lab Results  Component Value Date   HIV NON-REACTIVE 11/02/2017   HIV NON-REACTIVE 10/06/2017   HIV NONREACTIVE 06/21/2016   Hepatitis B Lab Results  Component Value Date   HEPBSAB REACTIVE (A) 10/06/2017   HEPBSAG NON-REACTIVE 10/06/2017   Hepatitis C Lab Results  Component Value Date   HEPCAB NON-REACTIVE 10/06/2017   Hepatitis A Lab Results  Component Value Date   HAV REACTIVE (A) 10/06/2017   RPR and STI Lab Results  Component Value Date   LABRPR NON-REACTIVE 10/06/2017    STI Results GC CT  10/06/2017 Negative Negative  10/06/2017 Negative Negative  10/06/2017 Negative Negative   Assessment: Catherine Lane is here for her 3 month PrEP follow up visit. She reports that she has not taken Truvada for ~2 weeks because she ran out of refills. However, the last prescription was sent for #30 with 2 refills on 11/03/17. She has continued to have sex with her HIV+ partner during this time without condoms. Her partner is on ART and she confirms that he takes his medication every day. She denies any s/sx of acute HIV infection.  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 She has now been hired on at [employer] and is expecting to have new insurance coverage soon. In the meantime, she has been using Catherine Lane. She reports making $15.75/hr and works 30-40 hours per week. Income verification letter written and signed during this visit.  Plan: Pregnancy test HIV Ab 3 months Truvada if negative Next 3  month f/u on 05/02/18 @ 0915  Catherine Lane. Zigmund Daniel, PharmD PGY2 Infectious Diseases Pharmacy Resident Phone: [phone] 01/31/2018, 10:53 AM

## 2018-01-31 NOTE — Progress Notes (Signed)
 Date:  01/31/2018   HPI: Catherine Lane is a 22 y.o. female here for her 3 month PrEP follow up visit.  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 and expecting to have insurance coverage soon  Patient Active Problem List   Diagnosis Date Noted   Headache 02/10/2015   Asthma, mild intermittent 07/30/2014   Irregular periods 07/30/2014    Patient's Medications  New Prescriptions   No medications on file  Previous Medications   ALBUTEROL  (PROVENTIL  HFA;VENTOLIN  HFA) 108 (90 BASE) MCG/ACT INHALER    Inhale 2 puffs into the lungs every 4 (four) hours as needed for wheezing or shortness of breath.   AMOXICILLIN  (AMOXIL ) 875 MG TABLET    Take 1 tablet (875 mg total) by mouth 2 (two) times daily.   EMTRICITABINE -TENOFOVIR  (TRUVADA) 200-300 MG TABLET    Take 1 tablet by mouth daily.   IBUPROFEN  (ADVIL ,MOTRIN ) 600 MG TABLET    Take 1 tablet (600 mg total) by mouth every 8 (eight) hours as needed (Take with food.).   NORELGESTROMIN-ETHINYL ESTRADIOL  (ORTHO EVRA) 150-35 MCG/24HR TRANSDERMAL PATCH    Place 1 patch onto the skin once a week.   VALACYCLOVIR  (VALTREX ) 500 MG TABLET    Take 500 mg by mouth daily.  Modified Medications   No medications on file  Discontinued Medications   No medications on file    Allergies:  Past Medical History: Past Medical History:  Diagnosis Date   Anemia    Anxiety    Depression    OCD (obsessive compulsive disorder)     Social History: Social History   Socioeconomic History   Marital status: Single    Spouse name: Not on file   Number of children: Not on file   Years of education: Not on file   Highest education level: Not on file  Occupational History   Not on file  Social Needs   Financial resource strain: Not on file   Food insecurity:    Worry: Not on file    Inability: Not on file   Transportation needs:    Medical: Not on file     Non-medical: Not on file  Tobacco Use   Smoking status: Never Smoker   Smokeless tobacco: Never Used  Substance and Sexual Activity   Alcohol use: No    Alcohol/week: 0.0 standard drinks   Drug use: No   Sexual activity: Never    Birth control/protection: None  Lifestyle   Physical activity:    Days per week: Not on file    Minutes per session: Not on file   Stress: Not on file  Relationships   Social connections:    Talks on phone: Not on file    Gets together: Not on file    Attends religious service: Not on file    Active member of club or organization: Not on file    Attends meetings of clubs or organizations: Not on file    Relationship status: Not on file  Other Topics Concern   Not on file  Social History Narrative   Not on file    [facility] HIV PREP FLOWSHEET RESULTS 01/31/2018 01/31/2018 11/02/2017 10/06/2017  Insurance Status Insured Uninsured Insured Insured  How did you hear? - a referral from my doctor Friend, boyfriend  Friend, boyfriend is infect  Gender at birth Female Female Female Female  Gender  identity cis-Female cis-Female cis-Female cis-Female  Risk for HIV In sexual relationship with HIV+ partner;Condomless vaginal or anal intercourse In sexual relationship with HIV+ partner;Condomless vaginal or anal intercourse In sexual relationship with HIV+ partner;Condomless vaginal or anal intercourse In sexual relationship with HIV+ partner;Condomless vaginal or anal intercourse;Hx of STI  Sex Partners Men only Women only Men only Men only  # sex partners past 3-6 mos 1-3 1-3 1-3 1-3  Sex activity preferences Receptive;Oral Receptive;Oral Receptive;Oral Receptive;Oral  Condom use No No No Yes  % condom use - - 0 48  Partners genders and ages - M 20-24 M 20-24 M 20-24  Treated for STI? No No No Yes  HIV symptoms? N/A N/A - N/A  PrEP Eligibility HIV negative;Substantial risk for HIV;CrCl >60 ml/min HIV negative;Past treatment for potential exposure to HIV from sex or  injectable drugs Substantial risk for HIV;CrCl >60 ml/min;HIV negative HIV negative;Substantial risk for HIV  Preg status No No No No  Breastfeeding? No No N/A N/A  Paper work received? - No - No    Labs:  SCr: Lab Results  Component Value Date   CREATININE 0.75 11/02/2017   CREATININE 0.76 10/06/2017   CREATININE 0.75 06/21/2016   HIV Lab Results  Component Value Date   HIV NON-REACTIVE 11/02/2017   HIV NON-REACTIVE 10/06/2017   HIV NONREACTIVE 06/21/2016   Hepatitis B Lab Results  Component Value Date   HEPBSAB REACTIVE (A) 10/06/2017   HEPBSAG NON-REACTIVE 10/06/2017   Hepatitis C Lab Results  Component Value Date   HEPCAB NON-REACTIVE 10/06/2017   Hepatitis A Lab Results  Component Value Date   HAV REACTIVE (A) 10/06/2017   RPR and STI Lab Results  Component Value Date   LABRPR NON-REACTIVE 10/06/2017    STI Results GC CT  10/06/2017 Negative Negative  10/06/2017 Negative Negative  10/06/2017 Negative Negative    Assessment: Catherine Lane is here for her 3 month PrEP follow up visit. She reports that she has not taken Truvada for ~2 weeks because she ran out of refills. However, the last prescription was sent for #30 with 2 refills on 11/03/17. She has continued to have sex with her HIV+ partner during this time without condoms. Her partner is on ART and she confirms that he takes his medication every day. She denies any s/sx of acute HIV infection.  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 She has now been hired on at [employer] and is expecting to have new insurance coverage soon. In the meantime, she has been using [place] patient assistance. She reports making $15.75/hr and works 30-40 hours per week. Income verification letter written and signed during this visit.  Plan: Pregnancy test HIV Ab 3 months Truvada if negative Next 3 month f/u on 05/02/18 @ 0915  Catherine Lane N. Posey, PharmD PGY2 Infectious  Diseases Pharmacy Resident Phone: [phone] 01/31/2018, 10:08 AM

## 2018-01-31 NOTE — Progress Notes (Signed)
Date:  01/31/2018   HPI: Catherine Lane is a 22 y.o. female here for her 3 month PrEP follow up visit.  Insured   []    Uninsured  [x]    Patient Active Problem List   Diagnosis Date Noted  . Headache 02/10/2015  . Asthma, mild intermittent 07/30/2014  . Irregular periods 07/30/2014    Patient's Medications  New Prescriptions   No medications on file  Previous Medications   ALBUTEROL (PROVENTIL HFA;VENTOLIN HFA) 108 (90 BASE) MCG/ACT INHALER    Inhale 2 puffs into the lungs every 4 (four) hours as needed for wheezing or shortness of breath.   AMOXICILLIN (AMOXIL) 875 MG TABLET    Take 1 tablet (875 mg total) by mouth 2 (two) times daily.   EMTRICITABINE-TENOFOVIR (TRUVADA) 200-300 MG TABLET    Take 1 tablet by mouth daily.   IBUPROFEN (ADVIL,MOTRIN) 600 MG TABLET    Take 1 tablet (600 mg total) by mouth every 8 (eight) hours as needed (Take with food.).   NORELGESTROMIN-ETHINYL ESTRADIOL (ORTHO EVRA) 150-35 MCG/24HR TRANSDERMAL PATCH    Place 1 patch onto the skin once a week.   VALACYCLOVIR (VALTREX) 500 MG TABLET    Take 500 mg by mouth daily.  Modified Medications   No medications on file  Discontinued Medications   No medications on file    Allergies: Allergies  Allergen Reactions  . Lane Tree Pollen [Corylus] Anaphylaxis    Hazelnuts     Past Medical History: Past Medical History:  Diagnosis Date  . Anemia   . Anxiety   . Depression   . OCD (obsessive compulsive disorder)     Social History: Social History   Socioeconomic History  . Marital status: Single    Spouse name: Not on file  . Number of children: Not on file  . Years of education: Not on file  . Highest education level: Not on file  Occupational History  . Not on file  Social Needs  . Financial resource strain: Not on file  . Food insecurity:    Worry: Not on file    Inability: Not on file  . Transportation needs:    Medical: Not on file    Non-medical: Not on file  Tobacco Use  .  Smoking status: Never Smoker  . Smokeless tobacco: Never Used  Substance and Sexual Activity  . Alcohol use: No    Alcohol/week: 0.0 standard drinks  . Drug use: No  . Sexual activity: Never    Birth control/protection: None  Lifestyle  . Physical activity:    Days per week: Not on file    Minutes per session: Not on file  . Stress: Not on file  Relationships  . Social connections:    Talks on phone: Not on file    Gets together: Not on file    Attends religious service: Not on file    Active member of club or organization: Not on file    Attends meetings of clubs or organizations: Not on file    Relationship status: Not on file  Other Topics Concern  . Not on file  Social History Narrative  . Not on file    [facility] HIV PREP FLOWSHEET RESULTS 01/31/2018 01/31/2018 11/02/2017 10/06/2017  Insurance Status Insured Uninsured Insured Insured  How did you hear? - a referral from my doctor Friend, boyfriend  Friend, boyfriend is infect  Gender at birth Female Female Female Female  Gender identity cis-Female cis-Female cis-Female cis-Female  Risk  for HIV In sexual relationship with HIV+ partner;Condomless vaginal or anal intercourse In sexual relationship with HIV+ partner;Condomless vaginal or anal intercourse In sexual relationship with HIV+ partner;Condomless vaginal or anal intercourse In sexual relationship with HIV+ partner;Condomless vaginal or anal intercourse;Hx of STI  Sex Partners Men only Women only Men only Men only  # sex partners past 3-6 mos 1-3 1-3 1-3 1-3  Sex activity preferences Receptive;Oral Receptive;Oral Receptive;Oral Receptive;Oral  Condom use No No No Yes  % condom use - - 0 35  Partners genders and ages - M 20-24 M 20-24 M 20-24  Treated for STI? No No No Yes  HIV symptoms? N/A N/A - N/A  PrEP Eligibility HIV negative;Substantial risk for HIV;CrCl >60 ml/min HIV negative;Past treatment for potential exposure to HIV from sex or injectable drugs Substantial risk for  HIV;CrCl >60 ml/min;HIV negative HIV negative;Substantial risk for HIV  Preg status No No No No  Breastfeeding? No No N/A N/A  Paper work received? - No - No    Labs:  SCr: Lab Results  Component Value Date   CREATININE 0.75 11/02/2017   CREATININE 0.76 10/06/2017   CREATININE 0.75 06/21/2016   HIV Lab Results  Component Value Date   HIV NON-REACTIVE 11/02/2017   HIV NON-REACTIVE 10/06/2017   HIV NONREACTIVE 06/21/2016   Hepatitis B Lab Results  Component Value Date   HEPBSAB REACTIVE (A) 10/06/2017   HEPBSAG NON-REACTIVE 10/06/2017   Hepatitis C Lab Results  Component Value Date   HEPCAB NON-REACTIVE 10/06/2017   Hepatitis A Lab Results  Component Value Date   HAV REACTIVE (A) 10/06/2017   RPR and STI Lab Results  Component Value Date   LABRPR NON-REACTIVE 10/06/2017    STI Results GC CT  10/06/2017 Negative Negative  10/06/2017 Negative Negative  10/06/2017 Negative Negative   Assessment: Catherine Lane is here for her 3 month PrEP follow up visit. She reports that she has not taken Truvada for ~2 weeks because she ran out of refills. However, the last prescription was sent for #30 with 2 refills on 11/03/17. She has continued to have sex with her HIV+ partner during this time without condoms. Her partner is on ART and she confirms that he takes his medication every day. She denies any s/sx of acute HIV infection.  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 She has now been hired on at [employer] and is expecting to have new insurance coverage soon. In the meantime, she has been using [place] patient assistance. She reports making $15.75/hr and works 30-40 hours per week. Income verification letter written and signed during this visit.  Plan: Pregnancy test HIV Ab 3 months Truvada if negative Next 3 month f/u on 05/02/18 @ 0915  Catherine Lane N. Zigmund Daniel, PharmD PGY2 Infectious Diseases Pharmacy Resident Phone:  [phone] 01/31/2018, 2:49 PM

## 2018-01-31 NOTE — Progress Notes (Deleted)
Error

## 2018-02-01 ENCOUNTER — Telehealth: Payer: Self-pay | Admitting: Pharmacist

## 2018-02-01 DIAGNOSIS — Z7251 High risk heterosexual behavior: Secondary | ICD-10-CM

## 2018-02-01 LAB — HIV ANTIBODY (ROUTINE TESTING W REFLEX): HIV 1&2 Ab, 4th Generation: NONREACTIVE

## 2018-02-01 MED ORDER — EMTRICITABINE-TENOFOVIR DF 200-300 MG PO TABS: 1.0000 | ORAL_TABLET | Freq: Every day | ORAL | 2 refills | Status: DC

## 2018-02-01 MED FILL — TRUVADA 200-300 MG TABS: 200-300 | 30 days supply | Qty: 30 | Fill #1

## 2018-02-01 NOTE — Telephone Encounter (Signed)
Called patient to let her know that her HIV antibody was negative.  Will send in 3 more months of Truvada to [facility], and they will mail it to her.

## 2018-02-28 MED FILL — TRUVADA 200-300 MG TABS: 200-300 | 30 days supply | Qty: 30 | Fill #2

## 2018-03-03 ENCOUNTER — Encounter (HOSPITAL_COMMUNITY): Payer: Self-pay | Admitting: Emergency Medicine

## 2018-03-03 ENCOUNTER — Other Ambulatory Visit: Payer: Self-pay

## 2018-03-03 ENCOUNTER — Ambulatory Visit (HOSPITAL_COMMUNITY)
Admission: EM | Admit: 2018-03-03 | Discharge: 2018-03-03 | Disposition: A | Discharge status: Home / Self Care | Payer: Self-pay | Attending: Family Medicine | Admitting: Family Medicine

## 2018-03-03 DIAGNOSIS — F329 Major depressive disorder, single episode, unspecified: Secondary | ICD-10-CM | POA: Insufficient documentation

## 2018-03-03 DIAGNOSIS — R21 Rash and other nonspecific skin eruption: Secondary | ICD-10-CM | POA: Insufficient documentation

## 2018-03-03 DIAGNOSIS — J029 Acute pharyngitis, unspecified: Secondary | ICD-10-CM

## 2018-03-03 DIAGNOSIS — J4520 Mild intermittent asthma, uncomplicated: Secondary | ICD-10-CM | POA: Insufficient documentation

## 2018-03-03 DIAGNOSIS — F429 Obsessive-compulsive disorder, unspecified: Secondary | ICD-10-CM | POA: Insufficient documentation

## 2018-03-03 DIAGNOSIS — R110 Nausea: Secondary | ICD-10-CM | POA: Insufficient documentation

## 2018-03-03 DIAGNOSIS — Z79899 Other long term (current) drug therapy: Secondary | ICD-10-CM | POA: Insufficient documentation

## 2018-03-03 DIAGNOSIS — F419 Anxiety disorder, unspecified: Secondary | ICD-10-CM | POA: Insufficient documentation

## 2018-03-03 DIAGNOSIS — Z8249 Family history of ischemic heart disease and other diseases of the circulatory system: Secondary | ICD-10-CM | POA: Insufficient documentation

## 2018-03-03 DIAGNOSIS — R197 Diarrhea, unspecified: Secondary | ICD-10-CM | POA: Insufficient documentation

## 2018-03-03 DIAGNOSIS — Z888 Allergy status to other drugs, medicaments and biological substances status: Secondary | ICD-10-CM | POA: Insufficient documentation

## 2018-03-03 LAB — POCT RAPID STREP A: STREPTOCOCCUS, GROUP A SCREEN (DIRECT): NEGATIVE

## 2018-03-03 MED ORDER — CETIRIZINE HCL 10 MG PO CAPS: 10.0000 mg | ORAL_CAPSULE | Freq: Every day | ORAL | 0 refills | Status: DC

## 2018-03-03 MED ORDER — CLOTRIMAZOLE-BETAMETHASONE 1-0.05 % EX CREA: 1.0000 "application " | TOPICAL_CREAM | Freq: Two times a day (BID) | CUTANEOUS | 0 refills | Status: DC

## 2018-03-03 NOTE — ED Provider Notes (Signed)
[place]    CSN: [id number] Arrival date & time: 03/03/18  1639     History   Chief Complaint Chief Complaint  Patient presents with  . Diarrhea  . Rash  . Sore Throat    HPI Catherine Lane is a 22 y.o. female history of anxiety, asthma presenting today for evaluation of a sore throat, diarrhea and a rash.  Patient states that for the past 2 to 3 weeks she has had a sore throat.  Initially pain was worse on the left side and also had ear pain.  The symptoms have resolved, but she has had persistent sore throat.  Initially had cough and congestion to begin with but this has resolved as well.  She did not take any over-the-counter medicines to help her symptoms.  She has also had diarrhea, states that she was going approximately 8 times a day, today is gone 4 times.  States that she has not have any improvement in the consistency of the stool and is still pure water.  She has had associated nausea, but denies vomiting.  Denies associated abdominal pain.  Patient recently ended her menstrual cycle.  She is also noticed a rash to bilateral inner thighs, feels a dry burning sensation.  This began approximately 3 weeks ago as well.  States that it has not changed over the 3 weeks.  She has not tried anything on this area.  Denies any new soaps, lotions or hygiene products.  Rashes associated with itching.  HPI  Past Medical History:  Diagnosis Date  . Anemia   . Anxiety   . Depression   . OCD (obsessive compulsive disorder)     Patient Active Problem List   Diagnosis Date Noted  . Headache 02/10/2015  . Asthma, mild intermittent 07/30/2014  . Irregular periods 07/30/2014    History reviewed. No pertinent surgical history.  OB History    Gravida  0   Para  0   Term  0   Preterm  0   AB  0   Living  0     SAB  0   TAB  0   Ectopic  0   Multiple  0   Live Births               Home Medications    Prior to Admission medications     Medication Sig Start Date End Date Taking? Authorizing Provider  albuterol (PROVENTIL HFA;VENTOLIN HFA) 108 (90 Base) MCG/ACT inhaler Inhale 2 puffs into the lungs every 4 (four) hours as needed for wheezing or shortness of breath. 06/21/16  Yes Hairston, Mandesia R, FNP  emtricitabine-tenofovir (TRUVADA) 200-300 MG tablet Take 1 tablet by mouth daily. 02/01/18  Yes Kuppelweiser, Cassie L, RPH-CPP  Cetirizine HCl 10 MG CAPS Take 1 capsule (10 mg total) by mouth daily for 15 days. 03/03/18 03/18/18  Wieters, Hallie C, PA-C  clotrimazole-betamethasone (LOTRISONE) cream Apply 1 application topically 2 (two) times daily. 03/03/18   Wieters, Elesa Hacker, PA-C    Family History Family History  Problem Relation Age of Onset  . Asthma Father   . Hyperlipidemia Father   . Hypertension Father   . Diabetes Father   . Hyperlipidemia Paternal Aunt   . Cancer Paternal Uncle   . Diabetes Paternal Uncle   . Hyperlipidemia Paternal Grandmother     Social History Social History   Tobacco Use  . Smoking status: Never Smoker  . Smokeless tobacco: Never Used  Substance  Use Topics  . Alcohol use: No    Alcohol/week: 0.0 standard drinks  . Drug use: No     Allergies   Hazel tree pollen [corylus]   Review of Systems Review of Systems  Constitutional: Negative for activity change, appetite change, chills, fatigue and fever.  HENT: Positive for sore throat. Negative for congestion, ear pain, rhinorrhea, sinus pressure and trouble swallowing.   Eyes: Negative for discharge and redness.  Respiratory: Negative for cough, chest tightness and shortness of breath.   Cardiovascular: Negative for chest pain.  Gastrointestinal: Positive for diarrhea and nausea. Negative for abdominal pain and vomiting.  Genitourinary: Negative for dysuria and menstrual problem.  Musculoskeletal: Negative for myalgias.  Skin: Positive for rash.  Neurological: Negative for dizziness, light-headedness and headaches.      Physical Exam Triage Vital Signs ED Triage Vitals  Enc Vitals Group     BP 03/03/18 1655 125/75     Pulse Rate 03/03/18 1655 98     Resp --      Temp 03/03/18 1655 99.4 F (37.4 C)     Temp Source 03/03/18 1655 Oral     SpO2 03/03/18 1655 100 %     Weight --      Height --      Head Circumference --      Peak Flow --      Pain Score 03/03/18 1656 2     Pain Loc --      Pain Edu? --      Excl. in [place]? --    No data found.  Updated Vital Signs BP 125/75 (BP Location: Left Arm)   Pulse 98   Temp 99.4 F (37.4 C) (Oral)   LMP 02/23/2018 (Exact Date)   SpO2 100%   Visual Acuity Right Eye Distance:   Left Eye Distance:   Bilateral Distance:    Right Eye Near:   Left Eye Near:    Bilateral Near:     Physical Exam  Constitutional: She is oriented to person, place, and time. She appears well-developed and well-nourished. No distress.  HENT:  Head: Normocephalic and atraumatic.  Bilateral ears without tenderness to palpation of external auricle, tragus and mastoid, EAC's without erythema or swelling, TM's with good bony landmarks and cone of light. Non erythematous.  Oral mucosa pink and moist, mild tonsillar enlargement or exudate. Posterior pharynx patent and nonerythematous, no uvula deviation or swelling. Normal phonation.  Eyes: Conjunctivae are normal.  Neck: Neck supple.  Cardiovascular: Normal rate and regular rhythm.  No murmur heard. Pulmonary/Chest: Effort normal and breath sounds normal. No respiratory distress.  Breathing comfortably at rest, CTABL, no wheezing, rales or other adventitious sounds auscultated  Abdominal: Soft. There is no tenderness.  Nontender to light deep palpation throughout all 4 quadrants and epigastrium.  Musculoskeletal: She exhibits no edema.  Neurological: She is alert and oriented to person, place, and time.  Skin: Skin is warm and dry.  Dry peeling rash to bilateral medial thighs; no erythema, nontender to palpation, no  induration  Psychiatric: She has a normal mood and affect.  Nursing note and vitals reviewed.    UC Treatments / Results  Labs (all labs ordered are listed, but only abnormal results are displayed) Labs Reviewed  CULTURE, GROUP A STREP [facility])  POCT RAPID STREP A    EKG None  Radiology No results found.  Procedures Procedures (including critical care time)  Medications Ordered in UC Medications - No data to display  Initial Impression /  Assessment and Plan / UC Course  I have reviewed the triage vital signs and the nursing notes.  Pertinent labs & imaging results that were available during my care of the patient were reviewed by me and considered in my medical decision making (see chart for details).     Sore throat-strep test negative, would expect viral illness to resolve by now, will treat for drainage, recommending to begin daily allergy pill.  Discussed further symptomatic management of sore throat. Diarrhea-seems to be slightly improving in terms of frequency, provided patient with stool kit to go to return if diarrhea is still persisting to send off for GI pathogens.  Abdominal exam unremarkable, nontender, not concerning for acute abdomen. Rash- possible chafing versus yeast infection versus eczema, will provide Lotrisone cream to apply twice daily.  Vital signs stable, no acute distress, continue to monitor symptoms.Discussed strict return precautions. Patient verbalized understanding and is agreeable with plan.  Final Clinical Impressions(s) / UC Diagnoses   Final diagnoses:  Sore throat  Diarrhea, unspecified type  Rash and nonspecific skin eruption     Discharge Instructions     Sore Throat  Your rapid strep tested Negative today. We will send for a culture and call in about 2 days if results are positive. For now we will treat your sore throat as a virus with symptom management.   Please continue Tylenol or Ibuprofen for fever and pain. May try salt  water gargles, cepacol lozenges, throat spray, or OTC cold relief medicine for throat discomfort. If you also have congestion take a daily anti-histamine like Zyrtec, Claritin, and a oral decongestant to help with post nasal drip that may be irritating your throat.   Stay hydrated and drink plenty of fluids to keep your throat coated relieve irritation.   Rash Please apply Lotrisone cream to rash twice daily consistently over the next 2 weeks  Diarrhea Please drink plenty of fluids to avoid dehydration. Drink pedialyte, gatorade. Continue to monitor for continued improvement daily. May return stool sample if not improving.   Please return if developing worsening symptoms, difficulty breathing/swallowing, worsening sore throat, neck stiffness, fever, persistent diarrhea, vomiting, abdominal pain, lightheadedness, dizziness, rash worsening, spreading.   ED Prescriptions    Medication Sig Dispense Auth. Provider   clotrimazole-betamethasone (LOTRISONE) cream Apply 1 application topically 2 (two) times daily. 30 g Wieters, Hallie C, PA-C   Cetirizine HCl 10 MG CAPS Take 1 capsule (10 mg total) by mouth daily for 15 days. 15 capsule Wieters, Hallie C, PA-C     Controlled Substance Prescriptions [place] Controlled Substance Registry consulted? Not Applicable   Janith Lima, [place] 03/03/18 1828

## 2018-03-03 NOTE — Discharge Instructions (Addendum)
Sore Throat  Your rapid strep tested Negative today. We will send for a culture and call in about 2 days if results are positive. For now we will treat your sore throat as a virus with symptom management.   Please continue Tylenol or Ibuprofen for fever and pain. May try salt water gargles, cepacol lozenges, throat spray, or OTC cold relief medicine for throat discomfort. If you also have congestion take a daily anti-histamine like Zyrtec, Claritin, and a oral decongestant to help with post nasal drip that may be irritating your throat.   Stay hydrated and drink plenty of fluids to keep your throat coated relieve irritation.   Rash Please apply Lotrisone cream to rash twice daily consistently over the next 2 weeks  Diarrhea Please drink plenty of fluids to avoid dehydration. Drink pedialyte, gatorade. Continue to monitor for continued improvement daily. May return stool sample if not improving.   Please return if developing worsening symptoms, difficulty breathing/swallowing, worsening sore throat, neck stiffness, fever, persistent diarrhea, vomiting, abdominal pain, lightheadedness, dizziness, rash worsening, spreading.

## 2018-03-03 NOTE — ED Triage Notes (Signed)
Pt reports a sore throat, diarrhea and a rash on her inner upper thighs to the back of her thighs x3 weeks.  Pt also reports left ear pain that she had two weeks ago that has since resolved.

## 2018-03-06 LAB — CULTURE, GROUP A STREP (THRC)

## 2018-03-17 ENCOUNTER — Encounter (HOSPITAL_COMMUNITY): Payer: Self-pay | Admitting: Emergency Medicine

## 2018-03-17 ENCOUNTER — Ambulatory Visit (HOSPITAL_COMMUNITY)
Admission: EM | Admit: 2018-03-17 | Discharge: 2018-03-17 | Disposition: A | Discharge status: Home / Self Care | Payer: Self-pay | Attending: Family Medicine | Admitting: Family Medicine

## 2018-03-17 ENCOUNTER — Other Ambulatory Visit: Payer: Self-pay

## 2018-03-17 DIAGNOSIS — F419 Anxiety disorder, unspecified: Secondary | ICD-10-CM | POA: Insufficient documentation

## 2018-03-17 DIAGNOSIS — J4520 Mild intermittent asthma, uncomplicated: Secondary | ICD-10-CM | POA: Insufficient documentation

## 2018-03-17 DIAGNOSIS — Z79899 Other long term (current) drug therapy: Secondary | ICD-10-CM | POA: Insufficient documentation

## 2018-03-17 DIAGNOSIS — F429 Obsessive-compulsive disorder, unspecified: Secondary | ICD-10-CM | POA: Insufficient documentation

## 2018-03-17 DIAGNOSIS — F329 Major depressive disorder, single episode, unspecified: Secondary | ICD-10-CM | POA: Insufficient documentation

## 2018-03-17 DIAGNOSIS — R197 Diarrhea, unspecified: Secondary | ICD-10-CM | POA: Insufficient documentation

## 2018-03-17 LAB — C DIFFICILE QUICK SCREEN W PCR REFLEX
C DIFFICILE (CDIFF) INTERP: NOT DETECTED
C DIFFICILE (CDIFF) TOXIN: NEGATIVE
C Diff antigen: NEGATIVE

## 2018-03-17 NOTE — Discharge Instructions (Addendum)
Please begin using probiotics, daily yogurt/activity or over-the-counter daily probiotic tablet like Florajen May try increasing fiber in diet Please follow-up with gastroenterology if still having persistent diarrhea

## 2018-03-17 NOTE — ED Provider Notes (Signed)
[facility]    CSN: [id number] Arrival date & time: 03/17/18  1713     History   Chief Complaint Chief Complaint  Patient presents with  . Diarrhea    HPI Catherine Lane is a 22 y.o. female history of asthma presenting today for evaluation of diarrhea.  Patient states that over the past month she has had persistent episodes of diarrhea.  Symptoms began around her last menstrual period which was 9/26.  This is typical for her to have diarrhea around her cycle, but persisted.  She was also concerned as of recently she had bright green diarrhea.  Her diarrhea has improved since it initially started.  Had a few days where her symptoms completely resolved.  Since she has approximately 3-4 bowel movements a day.  Consistency fluctuates from solid to water.  Denies antibiotic use.  She has also noticed an increase in gas.  Mild nausea, denies vomiting.  Has some lower abdominal pain that resolves with bowel movements.  Denies fevers.  Denies associated urinary symptoms of dysuria, increased frequency.  HPI  Past Medical History:  Diagnosis Date  . Anemia   . Anxiety   . Depression   . OCD (obsessive compulsive disorder)     Patient Active Problem List   Diagnosis Date Noted  . Headache 02/10/2015  . Asthma, mild intermittent 07/30/2014  . Irregular periods 07/30/2014    History reviewed. No pertinent surgical history.  OB History    Gravida  0   Para  0   Term  0   Preterm  0   AB  0   Living  0     SAB  0   TAB  0   Ectopic  0   Multiple  0   Live Births               Home Medications    Prior to Admission medications   Medication Sig Start Date End Date Taking? Authorizing Provider  albuterol (PROVENTIL HFA;VENTOLIN HFA) 108 (90 Base) MCG/ACT inhaler Inhale 2 puffs into the lungs every 4 (four) hours as needed for wheezing or shortness of breath. 06/21/16  Yes Hairston, Mandesia R, FNP  Cetirizine HCl 10 MG CAPS Take 1 capsule (10  mg total) by mouth daily for 15 days. 03/03/18 03/18/18 Yes Wieters, Hallie C, PA-C  clotrimazole-betamethasone (LOTRISONE) cream Apply 1 application topically 2 (two) times daily. 03/03/18  Yes Wieters, Hallie C, PA-C  emtricitabine-tenofovir (TRUVADA) 200-300 MG tablet Take 1 tablet by mouth daily. 02/01/18  Yes Kuppelweiser, Cassie L, RPH-CPP    Family History Family History  Problem Relation Age of Onset  . Asthma Father   . Hyperlipidemia Father   . Hypertension Father   . Diabetes Father   . Hyperlipidemia Paternal Aunt   . Cancer Paternal Uncle   . Diabetes Paternal Uncle   . Hyperlipidemia Paternal Grandmother     Social History Social History   Tobacco Use  . Smoking status: Never Smoker  . Smokeless tobacco: Never Used  Substance Use Topics  . Alcohol use: No    Alcohol/week: 0.0 standard drinks  . Drug use: No     Allergies   [place]ane tree pollen [corylus]   Review of Systems Review of Systems  Constitutional: Negative for activity change, appetite change, chills, fatigue and fever.  HENT: Negative for congestion, ear pain, rhinorrhea, sinus pressure, sore throat and trouble swallowing.   Eyes: Negative for discharge and redness.  Respiratory: Negative  for cough, chest tightness and shortness of breath.   Cardiovascular: Negative for chest pain.  Gastrointestinal: Positive for abdominal pain, diarrhea and nausea. Negative for vomiting.  Musculoskeletal: Negative for myalgias.  Skin: Negative for rash.  Neurological: Negative for dizziness, light-headedness and headaches.     Physical Exam Triage Vital Signs ED Triage Vitals  Enc Vitals Group     BP 03/17/18 1744 112/73     Pulse Rate 03/17/18 1744 98     Resp --      Temp 03/17/18 1744 99.4 F (37.4 C)     Temp Source 03/17/18 1744 Oral     SpO2 03/17/18 1744 99 %     Weight --      Height --      Head Circumference --      Peak Flow --      Pain Score 03/17/18 1742 0     Pain Loc --      Pain  Edu? --      Excl. in GC? --    No data found.  Updated Vital Signs BP 112/73 (BP Location: Left Arm)   Pulse 98   Temp 99.4 F (37.4 C) (Oral)   LMP 02/23/2018 (Exact Date)   SpO2 99%   Visual Acuity Right Eye Distance:   Left Eye Distance:   Bilateral Distance:    Right Eye Near:   Left Eye Near:    Bilateral Near:     Physical Exam  Constitutional: She appears well-developed and well-nourished. No distress.  HENT:  Head: Normocephalic and atraumatic.  Eyes: Conjunctivae are normal.  Neck: Neck supple.  Cardiovascular: Normal rate and regular rhythm.  No murmur heard. Pulmonary/Chest: Effort normal and breath sounds normal. No respiratory distress.  Breathing comfortably at rest, CTABL, no wheezing, rales or other adventitious sounds auscultated  Abdominal: Soft. There is tenderness.  Abdomen soft, nondistended, no guarding during exam, mild tenderness to bilateral lower quadrants of abdomen; no rebound, negative McBurney's, negative Rovsing.  Musculoskeletal: She exhibits no edema.  Neurological: She is alert.  Skin: Skin is warm and dry.  Psychiatric: She has a normal mood and affect.  Nursing note and vitals reviewed.    UC Treatments / Results  Labs (all labs ordered are listed, but only abnormal results are displayed) Labs Reviewed  GASTROINTESTINAL PANEL BY PCR, STOOL (REPLACES STOOL CULTURE)  C DIFFICILE QUICK SCREEN W PCR REFLEX    EKG None  Radiology No results found.  Procedures Procedures (including critical care time)  Medications Ordered in UC Medications - No data to display  Initial Impression / Assessment and Plan / UC Course  I have reviewed the triage vital signs and the nursing notes.  Pertinent labs & imaging results that were available during my care of the patient were reviewed by me and considered in my medical decision making (see chart for details).     Patient with persistent diarrhea, does seem to fluctuate between  solid and liquid, less likely infectious.  Despite this given length of time will obtain GI pathogen panel.  Discussed with patient possibility of IBS versus other functional diarrhea.  Will have patient try probiotics and fiber in the meantime, follow-up with gastroenterology if symptoms persisting and GI panel negative.Discussed strict return precautions. Patient verbalized understanding and is agreeable with plan.  Final Clinical Impressions(s) / UC Diagnoses   Final diagnoses:  Diarrhea, unspecified type     Discharge Instructions     Please begin using probiotics, daily yogurt/activity  or over-the-counter daily probiotic tablet like Florajen May try increasing fiber in diet Please follow-up with gastroenterology if still having persistent diarrhea    ED Prescriptions    None     Controlled Substance Prescriptions [place] Controlled Substance Registry consulted? Not Applicable   Lew Dawes, [place] 03/17/18 4098

## 2018-03-17 NOTE — ED Triage Notes (Signed)
Pt reports diarrhea x1 month.  Several episodes a day and she describes it the last few days as a weird green color.

## 2018-03-19 ENCOUNTER — Telehealth (HOSPITAL_COMMUNITY): Payer: Self-pay | Admitting: Emergency Medicine

## 2018-03-19 LAB — GASTROINTESTINAL PANEL BY PCR, STOOL (REPLACES STOOL CULTURE)

## 2018-03-19 MED ORDER — AZITHROMYCIN 250 MG PO TABS: 1000.0000 mg | ORAL_TABLET | Freq: Once | ORAL | 0 refills | Status: AC

## 2018-03-19 NOTE — Telephone Encounter (Signed)
The lab at [facility] called to report a Positive GI Panel result for E-Coli....hospital. Sharyon Cable, [place] advised.

## 2018-03-19 NOTE — Telephone Encounter (Signed)
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  Continue with follow-up with gastroenterology.

## 2018-03-23 MED FILL — TRUVADA 200-300 MG TABS: 200-300 | 30 days supply | Qty: 30 | Fill #0

## 2018-05-02 ENCOUNTER — Ambulatory Visit (INDEPENDENT_AMBULATORY_CARE_PROVIDER_SITE_OTHER): Payer: Self-pay | Admitting: Pharmacist

## 2018-05-02 DIAGNOSIS — N926 Irregular menstruation, unspecified: Secondary | ICD-10-CM

## 2018-05-02 DIAGNOSIS — Z7251 High risk heterosexual behavior: Secondary | ICD-10-CM

## 2018-05-02 LAB — POCT URINE PREGNANCY: Preg Test, Ur: NEGATIVE

## 2018-05-02 NOTE — Progress Notes (Signed)
Date:  05/02/2018   HPI: Catherine Lane is a 22 y.o. female presenting for 3 month PrEP follow up.  Insured   []    Uninsured  [x]    Patient Active Problem List   Diagnosis Date Noted  . Headache 02/10/2015  . Asthma, mild intermittent 07/30/2014  . Irregular periods 07/30/2014    Patient's Medications  New Prescriptions   No medications on file  Previous Medications   ALBUTEROL (PROVENTIL HFA;VENTOLIN HFA) 108 (90 BASE) MCG/ACT INHALER    Inhale 2 puffs into the lungs every 4 (four) hours as needed for wheezing or shortness of breath.   CETIRIZINE HCL 10 MG CAPS    Take 1 capsule (10 mg total) by mouth daily for 15 days.   CLOTRIMAZOLE-BETAMETHASONE (LOTRISONE) CREAM    Apply 1 application topically 2 (two) times daily.   EMTRICITABINE-TENOFOVIR (TRUVADA) 200-300 MG TABLET    Take 1 tablet by mouth daily.  Modified Medications   No medications on file  Discontinued Medications   No medications on file    Allergies: Allergies  Allergen Reactions  . [place] Tree Pollen [Corylus] Anaphylaxis     Hazelnuts     Past Medical History: Past Medical History:  Diagnosis Date  . Anemia   . Anxiety   . Depression   . OCD (obsessive compulsive disorder)     Social History: Social History   Socioeconomic History  . Marital status: Single    Spouse name: Not on file  . Number of children: Not on file  . Years of education: Not on file  . Highest education level: Not on file  Occupational History  . Not on file  Social Needs  . Financial resource strain: Not on file  . Food insecurity:    Worry: Not on file    Inability: Not on file  . Transportation needs:    Medical: Not on file    Non-medical: Not on file  Tobacco Use  . Smoking status: Never Smoker  . Smokeless tobacco: Never Used  Substance and Sexual Activity  . Alcohol use: No    Alcohol/week: 0.0 standard drinks  . Drug use: No  . Sexual activity: Never    Birth control/protection: None    Lifestyle  . Physical activity:    Days per week: Not on file    Minutes per session: Not on file  . Stress: Not on file  Relationships  . Social connections:    Talks on phone: Not on file    Gets together: Not on file    Attends religious service: Not on file    Active member of club or organization: Not on file    Attends meetings of clubs or organizations: Not on file    Relationship status: Not on file  Other Topics Concern  . Not on file  Social History Narrative  . Not on file    [facility] HIV PREP FLOWSHEET RESULTS 05/02/2018 05/02/2018 01/31/2018 01/31/2018 11/02/2017 10/06/2017  Insurance Status Uninsured Uninsured Insured Uninsured Insured Insured  How did you hear? - referred by gyno - a referral from my doctor Friend, boyfriend  Friend, boyfriend is infect  Gender at birth Female Female Female Female Female Female  Gender identity cis-Female cis-Female cis-Female cis-Female cis-Female cis-Female  Risk for HIV In sexual relationship with HIV+ partner;Condomless vaginal or anal intercourse In sexual relationship with HIV+ partner;Condomless vaginal or anal intercourse;Hx of STI In sexual relationship with HIV+ partner;Condomless vaginal or anal intercourse In sexual relationship  with HIV+ partner;Condomless vaginal or anal intercourse In sexual relationship with HIV+ partner;Condomless vaginal or anal intercourse In sexual relationship with HIV+ partner;Condomless vaginal or anal intercourse;Hx of STI  Sex Partners Men only Men only Men only Women only Men only Men only  # sex partners past 3-6 mos 1-3 1-3 1-3 1-3 1-3 1-3  Sex activity preferences Receptive;Oral Receptive;Oral Receptive;Oral Receptive;Oral Receptive;Oral Receptive;Oral  Condom use No No No No No Yes  % condom use - - - - 0 83  Partners genders and ages - M 20-24 - M 20-24 M 20-24 M 20-24  Treated for STI? No No No No No Yes  HIV symptoms? Sore throat Flu-like/Mono-like symptoms N/A N/A - N/A  PrEP Eligibility HIV  negative;CrCl >60 ml/min;Substantial risk for HIV HIV negative;Past treatment for potential exposure to HIV from sex or injectable drugs HIV negative;Substantial risk for HIV;CrCl >60 ml/min HIV negative;Past treatment for potential exposure to HIV from sex or injectable drugs Substantial risk for HIV;CrCl >60 ml/min;HIV negative HIV negative;Substantial risk for HIV  Preg status No No No No No No  Breastfeeding? No No No No N/A N/A  Paper work received? - No - No - No    Labs:  SCr: Lab Results  Component Value Date   CREATININE 0.75 11/02/2017   CREATININE 0.76 10/06/2017   CREATININE 0.75 06/21/2016   HIV Lab Results  Component Value Date   HIV NON-REACTIVE 01/31/2018   HIV NON-REACTIVE 11/02/2017   HIV NON-REACTIVE 10/06/2017   HIV NONREACTIVE 06/21/2016   Hepatitis B Lab Results  Component Value Date   HEPBSAB REACTIVE (A) 10/06/2017   HEPBSAG NON-REACTIVE 10/06/2017   Hepatitis C Lab Results  Component Value Date   HEPCAB NON-REACTIVE 10/06/2017   Hepatitis A Lab Results  Component Value Date   HAV REACTIVE (A) 10/06/2017   RPR and STI Lab Results  Component Value Date   LABRPR NON-REACTIVE 10/06/2017    STI Results GC CT  10/06/2017 Negative Negative  10/06/2017 Negative Negative  10/06/2017 Negative Negative    Assessment: Catherine Lane is here for routine 3 month PrEP follow up. She is in a relationship with an HIV+ man who is suppressed on ART. They do not use condoms. Catherine Lane continues to do well on Truvada with no side effects and no issues getting the medication from the pharmacy. She reports missing a few doses because she gets busy and forgets. 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 She also says that she had a cold right around thanksgiving and had a sore throat, as well as some congestion. She has since recovered from both of these illnesses.  At her last follow up visit, Catherine Lane had gotten a  new job and said she would be getting insurance soon. She has now signed up, but has not yet received her insurance cards. She will be covered by [place] patient assistance until she is able to use her insurance.  Catherine Lane says that her menstrual period is ~1 week late. She took a urine pregnancy test on Saturday that was negative. We will check a urine pregnancy test again today since hCG would not be covered by lab assistance. We recommended that if the test is negative and she still does not get her period within the next week that she follow up with her OB/GYN.  Plan: HIV Ab If negative, 3 months of Truvada Urine pregnancy test BMET Next f/u appointment on 3/3 at 1045  Roderic ScarceErin N. Zigmund Danieleja, PharmD PGY2 Infectious Diseases Pharmacy Resident Phone: [phone] 05/02/2018, 9:35 AM

## 2018-05-03 LAB — BASIC METABOLIC PANEL
BUN: 8 mg/dL (ref 7–25)
CHLORIDE: 105 mmol/L (ref 98–110)
CO2: 25 mmol/L (ref 20–32)
Calcium: 9.1 mg/dL (ref 8.6–10.2)
Creat: 0.73 mg/dL (ref 0.50–1.10)
Glucose, Bld: 99 mg/dL (ref 65–99)
Potassium: 4.3 mmol/L (ref 3.5–5.3)
Sodium: 137 mmol/L (ref 135–146)

## 2018-05-03 LAB — HIV ANTIBODY (ROUTINE TESTING W REFLEX): HIV 1&2 Ab, 4th Generation: NONREACTIVE

## 2018-05-04 ENCOUNTER — Telehealth: Payer: Self-pay | Admitting: Pharmacist

## 2018-05-04 DIAGNOSIS — Z7251 High risk heterosexual behavior: Secondary | ICD-10-CM

## 2018-05-04 MED ORDER — EMTRICITABINE-TENOFOVIR DF 200-300 MG PO TABS: 1.0000 | ORAL_TABLET | Freq: Every day | ORAL | 2 refills | Status: DC

## 2018-05-04 NOTE — Telephone Encounter (Signed)
Called patient to let her know that her HIV antibody was negative.  Will send in 3 more months of Truvada.  

## 2018-05-05 NOTE — Addendum Note (Signed)
Addended by: Robinette HainesKUPPELWEISER, Joyann Spidle L on: 05/05/2018 02:44 PM   Modules accepted: Level of Service

## 2018-05-16 MED FILL — TRUVADA 200-300 MG TABS: 200-300 | 30 days supply | Qty: 30 | Fill #1

## 2018-07-03 MED FILL — TRUVADA 200-300 MG TABS: 200-300 | 30 days supply | Qty: 30 | Fill #2

## 2018-07-25 DIAGNOSIS — R300 Dysuria: Secondary | ICD-10-CM | POA: Diagnosis not present

## 2018-07-25 DIAGNOSIS — Z113 Encounter for screening for infections with a predominantly sexual mode of transmission: Secondary | ICD-10-CM | POA: Diagnosis not present

## 2018-07-25 DIAGNOSIS — N760 Acute vaginitis: Secondary | ICD-10-CM | POA: Diagnosis not present

## 2018-07-25 DIAGNOSIS — R309 Painful micturition, unspecified: Secondary | ICD-10-CM | POA: Diagnosis not present

## 2018-08-01 ENCOUNTER — Ambulatory Visit: Payer: Self-pay | Admitting: Pharmacist

## 2018-08-01 ENCOUNTER — Telehealth: Payer: Self-pay | Admitting: Pharmacy Technician

## 2018-08-01 NOTE — Telephone Encounter (Signed)
[facility] Patient Advocate Encounter    Findings of the benefits investigation conducted this morning via test claims for the patient's upcoming appointment on 08/01/2018 at 10:45am are as follows:   Insurance: MEDCOPUB active status  Test run with drug historically used, Truvada Estimated copay amount: $0.00 Prior Authorization: not required on this month's fill of medication  [facility] Patient Advocate will follow up once patient arrives for their appointment and adjust test claim if medication changed.  Beulah Gandy, CPhT Specialty Pharmacy Patient [facility] for Infectious Disease Phone: [phone] Fax: [phone] 08/01/2018 8:30 AM

## 2018-08-02 DIAGNOSIS — Z01419 Encounter for gynecological examination (general) (routine) without abnormal findings: Secondary | ICD-10-CM | POA: Diagnosis not present

## 2018-08-02 DIAGNOSIS — Z113 Encounter for screening for infections with a predominantly sexual mode of transmission: Secondary | ICD-10-CM | POA: Diagnosis not present

## 2018-08-02 DIAGNOSIS — Z6836 Body mass index (BMI) 36.0-36.9, adult: Secondary | ICD-10-CM | POA: Diagnosis not present

## 2018-11-20 ENCOUNTER — Telehealth: Payer: BLUE CROSS/BLUE SHIELD | Admitting: Physician Assistant

## 2018-11-20 ENCOUNTER — Encounter: Payer: Self-pay | Admitting: Physician Assistant

## 2018-11-20 DIAGNOSIS — R05 Cough: Secondary | ICD-10-CM

## 2018-11-20 DIAGNOSIS — Z20828 Contact with and (suspected) exposure to other viral communicable diseases: Secondary | ICD-10-CM

## 2018-11-20 DIAGNOSIS — R519 Headache, unspecified: Secondary | ICD-10-CM

## 2018-11-20 DIAGNOSIS — R059 Cough, unspecified: Secondary | ICD-10-CM

## 2018-11-20 DIAGNOSIS — Z20822 Contact with and (suspected) exposure to covid-19: Secondary | ICD-10-CM

## 2018-11-20 NOTE — Progress Notes (Signed)
E-Visit for Corona Virus Screening   You have been enrolled in MyChart Home Monitoring for COVID-19.  Daily you will receive a questionnaire within the MyChart website. Our COVID-19 response team will be monitoring your responses daily.     

## 2018-11-20 NOTE — Progress Notes (Signed)
E-Visit for Corona Virus Screening   Your current symptoms could be consistent with the coronavirus.  Call your health care provider or local health department to request and arrange formal testing. Many health care providers can now test patients at their office but not all are.  Please quarantine yourself while awaiting your test results.  [place] [phone], [place], [place] [phone] or visit [email or web address]  and You have been enrolled in [place] for COVID-19.  Daily you will receive a questionnaire within the [place] website. Our COVID-19 response team will be monitoring your responses daily.    COVID-19 is a respiratory illness with symptoms that are similar to the flu. Symptoms are typically mild to moderate, but there have been cases of severe illness and death due to the virus. The following symptoms may appear 2-14 days after exposure: . Fever . Cough . Shortness of breath or difficulty breathing . Chills . Repeated shaking with chills . Muscle pain . Headache . Sore throat . New loss of taste or smell . Fatigue . Congestion or runny nose . Nausea or vomiting . Diarrhea  It is vitally important that if you feel that you have an infection such as this virus or any other virus that you stay home and away from places where you may spread it to others.  You should self-quarantine for 14 days if you have symptoms that could potentially be coronavirus or have been in close contact a with a person diagnosed with COVID-19 within the last 2 weeks. You should avoid contact with people age 36 and older.   You should wear a mask or cloth face covering over your nose and mouth if you must be around other people or animals, including pets (even at home). Try to stay at least 6 feet away from other people. This will protect the  people around you.   I have also provided a work note   You may also take acetaminophen (Tylenol) as needed for fever.   Reduce your risk of any infection by using the same precautions used for avoiding the common cold or flu:  Marland Kitchen Wash your hands often with soap and warm water for at least 20 seconds.  If soap and water are not readily available, use an alcohol-based hand sanitizer with at least 60% alcohol.  . If coughing or sneezing, cover your mouth and nose by coughing or sneezing into the elbow areas of your shirt or coat, into a tissue or into your sleeve (not your hands). . Avoid shaking hands with others and consider head nods or verbal greetings only. . Avoid touching your eyes, nose, or mouth with unwashed hands.  . Avoid close contact with people who are sick. . Avoid places or events with large numbers of people in one location, like concerts or sporting events. . Carefully consider travel plans you have or are making. . If you are planning any travel outside or inside the [place], visit the CDC's Travelers' Health webpage for the latest health notices. . If you have some symptoms but not all symptoms, continue to monitor at home and seek medical attention if your symptoms worsen. . If you are having a medical emergency, call 911.  HOME CARE . Only take medications as instructed by your medical team. . Drink plenty of fluids and get plenty of rest. . A steam or ultrasonic humidifier can help if you have congestion.   GET HELP RIGHT AWAY  IF YOU HAVE EMERGENCY WARNING SIGNS** FOR COVID-19. If you or someone is showing any of these signs seek emergency medical care immediately. Call 911 or proceed to your closest emergency facility if: . You develop worsening high fever. . Trouble breathing . Bluish lips or face . Persistent pain or pressure in the chest . New confusion . Inability to wake or stay awake . You cough up blood. . Your symptoms become more severe  **This list is  not all possible symptoms. Contact your medical provider for any symptoms that are sever or concerning to you.   MAKE SURE YOU   Understand these instructions.  Will watch your condition.  Will get help right away if you are not doing well or get worse.  Your e-visit answers were reviewed by a board certified advanced clinical practitioner to complete your personal care plan.  Depending on the condition, your plan could have included both over the counter or prescription medications.  If there is a problem please reply once you have received a response from your provider.  Your safety is important to us.  If you have drug allergies check your prescription carefully.    You can use MyChart to ask questions about today's visit, request a non-urgent call back, or ask for a work or school excuse for 24 hours related to this e-Visit. If it has been greater than 24 hours you will need to follow up with your provider, or enter a new e-Visit to address those concerns. You will get an e-mail in the next two days asking about your experience.  I hope that your e-visit has been valuable and will speed your recovery. Thank you for using e-visits.   I spent 5-10 minutes on review and completion of this note- Sahar Osman PAC  

## 2018-11-20 NOTE — Addendum Note (Signed)
Addended by: Waldon Merl on: 11/20/2018 04:05 PM   Modules accepted: Orders

## 2018-11-23 ENCOUNTER — Encounter (INDEPENDENT_AMBULATORY_CARE_PROVIDER_SITE_OTHER): Payer: Self-pay

## 2018-11-28 ENCOUNTER — Encounter (HOSPITAL_COMMUNITY): Payer: Self-pay

## 2018-11-28 ENCOUNTER — Ambulatory Visit (HOSPITAL_COMMUNITY)
Admission: EM | Admit: 2018-11-28 | Discharge: 2018-11-28 | Disposition: A | Discharge status: Home / Self Care | Payer: BC Managed Care – PPO | Attending: Family Medicine | Admitting: Family Medicine

## 2018-11-28 ENCOUNTER — Other Ambulatory Visit: Payer: Self-pay

## 2018-11-28 DIAGNOSIS — R102 Pelvic and perineal pain: Secondary | ICD-10-CM | POA: Diagnosis not present

## 2018-11-28 DIAGNOSIS — Z3202 Encounter for pregnancy test, result negative: Secondary | ICD-10-CM

## 2018-11-28 DIAGNOSIS — R110 Nausea: Secondary | ICD-10-CM | POA: Insufficient documentation

## 2018-11-28 DIAGNOSIS — N912 Amenorrhea, unspecified: Secondary | ICD-10-CM | POA: Diagnosis not present

## 2018-11-28 LAB — POCT URINALYSIS DIP (DEVICE)
Bilirubin Urine: NEGATIVE
Glucose, UA: NEGATIVE mg/dL
Hgb urine dipstick: NEGATIVE
Ketones, ur: NEGATIVE mg/dL
Leukocytes,Ua: NEGATIVE
Nitrite: NEGATIVE
Protein, ur: NEGATIVE mg/dL
Specific Gravity, Urine: 1.01 (ref 1.005–1.030)
Urobilinogen, UA: 0.2 mg/dL (ref 0.0–1.0)
pH: 6.5 (ref 5.0–8.0)

## 2018-11-28 LAB — POCT PREGNANCY, URINE: Preg Test, Ur: NEGATIVE

## 2018-11-28 MED ORDER — AZITHROMYCIN 250 MG PO TABS
ORAL_TABLET | ORAL | Status: AC
  Filled 2018-11-28: qty 4

## 2018-11-28 MED ORDER — CEFTRIAXONE SODIUM 250 MG IJ SOLR
INTRAMUSCULAR | Status: AC
  Filled 2018-11-28: qty 250

## 2018-11-28 MED ORDER — AZITHROMYCIN 250 MG PO TABS
1000.0000 mg | ORAL_TABLET | Freq: Once | ORAL | Status: AC
  Administered 2018-11-28: 1000 mg via ORAL

## 2018-11-28 MED ORDER — LIDOCAINE HCL (PF) 1 % IJ SOLN
INTRAMUSCULAR | Status: AC
  Filled 2018-11-28: qty 2

## 2018-11-28 MED ORDER — CEFTRIAXONE SODIUM 250 MG IJ SOLR
250.0000 mg | Freq: Once | INTRAMUSCULAR | Status: AC
  Administered 2018-11-28: 250 mg via INTRAMUSCULAR

## 2018-11-28 MED ORDER — NAPROXEN 500 MG PO TABS: 500.0000 mg | ORAL_TABLET | Freq: Two times a day (BID) | ORAL | 0 refills | Status: DC

## 2018-11-28 MED ORDER — ONDANSETRON 8 MG PO TBDP: 8.0000 mg | ORAL_TABLET | Freq: Three times a day (TID) | ORAL | 0 refills | Status: DC | PRN

## 2018-11-28 NOTE — ED Triage Notes (Signed)
Pt state she has missed her period and her LMP was 10/18/18. Pt states she has been feeling very nauseous.

## 2018-11-28 NOTE — ED Provider Notes (Signed)
MRN: [id number] DOB: 08-25-1995  Subjective:   Catherine Lane is a 23 y.o. female presenting for 1 week history of persistent nausea without vomiting, pelvic cramping, urinary frequency. Patient is sexually active and does not use condoms for protection. LMP was 10/18/2018, was regular and generally has consistent cycles. Denies smoking cigarettes. Has a few drinks a week.   Takes OCP, cannot recall the name.   Allergies to hazel, pollen.  Past Medical History:  Diagnosis Date  . Anemia   . Anxiety   . Depression   . OCD (obsessive compulsive disorder)      History reviewed. No pertinent surgical history.  ROS Denies fever, sinus pain, throat pain, chest pain, cough, vomiting, dysuria, hematuria, genital rash, vaginal discharge, rashes.  Objective:   Vitals: BP 114/75 (BP Location: Right Arm)   Pulse 95   Temp 98.8 F (37.1 C) (Oral)   Resp 18   Wt 200 lb (90.7 kg)   LMP 10/18/2018   SpO2 100%   BMI 36.58 kg/m   Physical Exam Constitutional:      General: She is not in acute distress.    Appearance: Normal appearance. She is well-developed and normal weight. She is not ill-appearing, toxic-appearing or diaphoretic.  HENT:     Head: Normocephalic and atraumatic.     Right Ear: External ear normal.     Left Ear: External ear normal.     Nose: Nose normal.     Mouth/Throat:     Mouth: Mucous membranes are moist.     Pharynx: Oropharynx is clear.  Eyes:     General: No scleral icterus.    Extraocular Movements: Extraocular movements intact.     Pupils: Pupils are equal, round, and reactive to light.  Cardiovascular:     Rate and Rhythm: Normal rate and regular rhythm.     Heart sounds: Normal heart sounds. No murmur. No friction rub. No gallop.   Pulmonary:     Effort: Pulmonary effort is normal. No respiratory distress.     Breath sounds: Normal breath sounds. No stridor. No wheezing, rhonchi or rales.  Abdominal:     General: Bowel sounds are normal.  There is no distension.     Palpations: Abdomen is soft. There is no mass.     Tenderness: There is no abdominal tenderness. There is no right CVA tenderness, left CVA tenderness, guarding or rebound.  Skin:    General: Skin is warm and dry.     Coloration: Skin is not pale.     Findings: No rash.  Neurological:     General: No focal deficit present.     Mental Status: She is alert and oriented to person, place, and time.  Psychiatric:        Mood and Affect: Mood normal.        Behavior: Behavior normal.        Thought Content: Thought content normal.        Judgment: Judgment normal.     Results for orders placed or performed during the hospital encounter of 11/28/18 (from the past 24 hour(s))  Pregnancy, urine POC     Status: None   Collection Time: 11/28/18  6:44 PM  Result Value Ref Range   Preg Test, Ur NEGATIVE NEGATIVE  POCT urinalysis dip (device)     Status: None   Collection Time: 11/28/18  6:52 PM  Result Value Ref Range   Glucose, UA NEGATIVE NEGATIVE mg/dL   Bilirubin Urine NEGATIVE NEGATIVE  Ketones, ur NEGATIVE NEGATIVE mg/dL   Specific Gravity, Urine 1.010 1.005 - 1.030   Hgb urine dipstick NEGATIVE NEGATIVE   pH 6.5 5.0 - 8.0   Protein, ur NEGATIVE NEGATIVE mg/dL   Urobilinogen, UA 0.2 0.0 - 1.0 mg/dL   Nitrite NEGATIVE NEGATIVE   Leukocytes,Ua NEGATIVE NEGATIVE    Assessment and Plan :   1. Amenorrhea   2. Pelvic cramping   3. Nausea without vomiting    We will treat patient empirically for STI given unprotected sex and symptoms that.  Labs are pending.  Use naproxen and Zofran for supportive care.  Discussed possibility of irregular bleeding while taking OCP.  She is to follow-up with her PCP and gynecologist. Counseled patient on potential for adverse effects with medications prescribed/recommended today, ER and return-to-clinic precautions discussed, patient verbalized understanding.    Wallis BambergMani, Nelly Scriven, [place] JerseyPA-C 11/28/18 1905

## 2018-11-29 LAB — CERVICOVAGINAL ANCILLARY ONLY
Bacterial vaginitis: NEGATIVE
Candida vaginitis: NEGATIVE
Chlamydia: NEGATIVE
Neisseria Gonorrhea: NEGATIVE
Trichomonas: NEGATIVE

## 2018-11-29 LAB — URINE CULTURE: Culture: <10000 — AB

## 2019-04-02 ENCOUNTER — Other Ambulatory Visit: Payer: Self-pay

## 2019-04-02 DIAGNOSIS — Z20822 Contact with and (suspected) exposure to covid-19: Secondary | ICD-10-CM

## 2019-04-04 LAB — NOVEL CORONAVIRUS, NAA: SARS-CoV-2, NAA: NOT DETECTED

## 2019-04-10 DIAGNOSIS — L209 Atopic dermatitis, unspecified: Secondary | ICD-10-CM | POA: Diagnosis not present

## 2019-04-18 DIAGNOSIS — L988 Other specified disorders of the skin and subcutaneous tissue: Secondary | ICD-10-CM | POA: Diagnosis not present

## 2019-05-28 ENCOUNTER — Telehealth: Payer: BC Managed Care – PPO | Admitting: Family

## 2019-05-28 DIAGNOSIS — R0600 Dyspnea, unspecified: Secondary | ICD-10-CM

## 2019-05-28 NOTE — Progress Notes (Signed)
  E-Visit for Corona Virus Screening  Based on what you have shared with me, you need to seek an evaluation for a severe illness that is causing your symptoms which may be coronavirus or some other illness. I recommend that you be seen and evaluated "face to face".   If you are considered high risk for Corona virus because of a known exposure, fever, shortness of breath and cough, OR if you have severe symptoms of any kind, seek medical care at an emergency room. Our Emergency Departments are best equipped to handle patients with severe symptoms.  You will be evaluated by the ER provider (or higher level of care provider) who will determine whether you need formal testing.  If you are having a true medical emergency please call 911.   I recommend the following:  . [facility] Emergency Department [address], [place], [place] [address] [phone]  . [facility] [place] Emergency Department [address], [place], [place] [address] [phone]  . [facility] Emergency Department [address], [place], [place] [address] [phone]  . [facility] Emergency Department [address], [place], [place] [address] [phone]  . [facility] Emergency Department [address], [place],  [address] [phone]  NOTE: If you entered your credit card information for this eVisit, you will not be charged. You may see a "hold" on your card for the $35 but that hold will drop off and you will not have a charge processed.   Your e-visit answers were reviewed by a board certified advanced clinical practitioner to complete your personal care plan.  Thank you for using e-Visits.  

## 2019-09-21 ENCOUNTER — Emergency Department (HOSPITAL_COMMUNITY)
Admission: EM | Admit: 2019-09-21 | Discharge: 2019-09-22 | Disposition: A | Discharge status: Home / Self Care | Payer: Self-pay | Attending: Emergency Medicine | Admitting: Emergency Medicine

## 2019-09-21 ENCOUNTER — Other Ambulatory Visit: Payer: Self-pay

## 2019-09-21 DIAGNOSIS — J4521 Mild intermittent asthma with (acute) exacerbation: Secondary | ICD-10-CM | POA: Insufficient documentation

## 2019-09-21 DIAGNOSIS — Z79899 Other long term (current) drug therapy: Secondary | ICD-10-CM | POA: Insufficient documentation

## 2019-09-21 MED ORDER — ALBUTEROL SULFATE HFA 108 (90 BASE) MCG/ACT IN AERS
4.0000 | INHALATION_SPRAY | Freq: Once | RESPIRATORY_TRACT | Status: AC
  Administered 2019-09-21: 21:00:00 4 via RESPIRATORY_TRACT
  Filled 2019-09-21: qty 6.7

## 2019-09-21 NOTE — ED Triage Notes (Signed)
Pt c/o asthma attack and states she is out of her medicine (inhaler).

## 2019-09-22 MED ORDER — ALBUTEROL SULFATE (5 MG/ML) 0.5% IN NEBU: 2.5000 mg | INHALATION_SOLUTION | Freq: Four times a day (QID) | RESPIRATORY_TRACT | 2 refills | Status: DC | PRN

## 2019-09-22 MED ORDER — ALBUTEROL SULFATE HFA 108 (90 BASE) MCG/ACT IN AERS: 2.0000 | INHALATION_SPRAY | RESPIRATORY_TRACT | 1 refills | Status: DC | PRN

## 2019-09-22 MED ORDER — NEBULIZER MISC: 0 refills | Status: AC

## 2019-09-22 NOTE — Discharge Instructions (Signed)
Medications have been sent to your pharmacy. It will be beneficial to start a daily allergy medication such as claritin, zyrtec, allegra, etc. Follow-up with your doctor on Tuesday as scheduled. Return here for any new/acute changes.

## 2019-09-22 NOTE — ED Provider Notes (Addendum)
[facility] EMERGENCY DEPARTMENT Provider Note   CSN: [id number] Arrival date & time: 09/21/19  2058     History Chief Complaint  Patient presents with  . Asthma    Catherine Lane is a 24 y.o. female.  The history is provided by the patient and medical records.  Asthma    24 y.o. F with hx of anemia, anxiety, depression, OCD, presenting to the ED with asthma attack.  States she has been having trouble for a few days due to change in the weather and pollen which is typical for her this time of year.  She has been out of her inhaler for a while.  She was given inhaler in triage and has used a few times with improvement, still feels like deep breathing is not quite at baseline.  No fever, chills, cough, sick contacts, or COVID Exposures.  She has PCP next week but could not wait any longer.  Past Medical History:  Diagnosis Date  . Anemia   . Anxiety   . Depression   . OCD (obsessive compulsive disorder)     Patient Active Problem List   Diagnosis Date Noted  . Headache 02/10/2015  . Asthma, mild intermittent 07/30/2014  . Irregular periods 07/30/2014    No past surgical history on file.   OB History    Gravida  0   Para  0   Term  0   Preterm  0   AB  0   Living  0     SAB  0   TAB  0   Ectopic  0   Multiple  0   Live Births              Family History  Problem Relation Age of Onset  . Asthma Father   . Hyperlipidemia Father   . Hypertension Father   . Diabetes Father   . Hyperlipidemia Paternal Aunt   . Cancer Paternal Uncle   . Diabetes Paternal Uncle   . Hyperlipidemia Paternal Grandmother     Social History   Tobacco Use  . Smoking status: Never Smoker  . Smokeless tobacco: Never Used  Substance Use Topics  . Alcohol use: No    Alcohol/week: 0.0 standard drinks  . Drug use: No    Home Medications Prior to Admission medications   Medication Sig Start Date End Date Taking? Authorizing Provider    albuterol (PROVENTIL HFA;VENTOLIN HFA) 108 (90 Base) MCG/ACT inhaler Inhale 2 puffs into the lungs every 4 (four) hours as needed for wheezing or shortness of breath. 06/21/16   Arrie Senate R, FNP  Cetirizine HCl 10 MG CAPS Take 1 capsule (10 mg total) by mouth daily for 15 days. 03/03/18 03/18/18  Wieters, Hallie C, PA-C  clotrimazole-betamethasone (LOTRISONE) cream Apply 1 application topically 2 (two) times daily. 03/03/18   Wieters, Hallie C, PA-C  naproxen (NAPROSYN) 500 MG tablet Take 1 tablet (500 mg total) by mouth 2 (two) times daily. 11/28/18   Wallis Bamberg, PA-C  ondansetron (ZOFRAN-ODT) 8 MG disintegrating tablet Take 1 tablet (8 mg total) by mouth every 8 (eight) hours as needed for nausea or vomiting. 11/28/18   Wallis Bamberg, PA-C    Allergies    Lane tree pollen [corylus]  Review of Systems   Review of Systems  Respiratory: Positive for wheezing.   All other systems reviewed and are negative.   Physical Exam Updated Vital Signs BP 129/86 (BP Location: Right Arm)   Pulse Marland Kitchen)  121   Temp 98 F (36.7 C) (Oral)   Resp 20   SpO2 96%   Physical Exam Vitals and nursing note reviewed.  Constitutional:      Appearance: She is well-developed.  HENT:     Head: Normocephalic and atraumatic.  Eyes:     Conjunctiva/sclera: Conjunctivae normal.     Pupils: Pupils are equal, round, and reactive to light.  Cardiovascular:     Rate and Rhythm: Regular rhythm. Tachycardia present.     Heart sounds: Normal heart sounds.     Comments: Tachy around 108 during exam Pulmonary:     Effort: Pulmonary effort is normal.     Breath sounds: Wheezing present. No rhonchi.     Comments: Faint end expiratory wheeze,  NAD, able to speak in full sentences without difficulty Abdominal:     General: Bowel sounds are normal.     Palpations: Abdomen is soft.  Musculoskeletal:        General: Normal range of motion.     Cervical back: Normal range of motion.  Skin:    General: Skin is warm  and dry.  Neurological:     Mental Status: She is alert and oriented to person, place, and time.     ED Results / Procedures / Treatments   Labs (all labs ordered are listed, but only abnormal results are displayed) Labs Reviewed - No data to display  EKG None  Radiology No results found.  Procedures Procedures (including critical care time)  Medications Ordered in ED Medications  albuterol (VENTOLIN HFA) 108 (90 Base) MCG/ACT inhaler 4 puff (4 puffs Inhalation Given 09/21/19 2107)    ED Course  I have reviewed the triage vital signs and the nursing notes.  Pertinent labs & imaging results that were available during my care of the patient were reviewed by me and considered in my medical decision making (see chart for details).    MDM Rules/Calculators/A&P  24 year old female presenting to the ED with asthma attack.  Has been having issues lately due to change in weather and pollen which is typical for her this time of year.  She is afebrile and nontoxic in appearance here.  She does have some end expiratory wheezes but is in no acute respiratory distress.  She has a low-grade tachycardia which I suspect is from the albuterol.  She has used 8 puffs of inhaler back-to-back with good improvement.  Her tachycardia has worsened but this is expected.  She is not having any chest pain.  I do not feel she needs cardiac work-up at this time.  She has scheduled follow-up with her primary care doctor 3 days from now, encouraged to keep this appointment.  I have written her for refills of inhaler as well as nebulizer machine and solution.  Instructed on home use.  Also instructed to start a daily OTC allergy medication such as claritin, zyrtec, etc.  She may return here for any new or acute changes.  Final Clinical Impression(s) / ED Diagnoses Final diagnoses:  Mild intermittent asthma with exacerbation    Rx / DC Orders ED Discharge Orders         Ordered    albuterol (VENTOLIN HFA)  108 (90 Base) MCG/ACT inhaler  Every 4 hours PRN     09/22/19 0036    albuterol (PROVENTIL) (5 MG/ML) 0.5% nebulizer solution  Every 6 hours PRN     09/22/19 0036    Nebulizer MISC     09/22/19 0036  Larene Pickett, PA-C 09/22/19 0039    Larene Pickett, PA-C 09/22/19 2446    Orpah Greek, MD 09/22/19 [phone]

## 2019-09-24 ENCOUNTER — Other Ambulatory Visit: Payer: Self-pay

## 2019-09-25 ENCOUNTER — Encounter: Payer: Self-pay | Admitting: Family Medicine

## 2019-09-25 ENCOUNTER — Ambulatory Visit (INDEPENDENT_AMBULATORY_CARE_PROVIDER_SITE_OTHER): Payer: PRIVATE HEALTH INSURANCE | Admitting: Family Medicine

## 2019-09-25 ENCOUNTER — Other Ambulatory Visit (HOSPITAL_COMMUNITY)
Admission: RE | Admit: 2019-09-25 | Discharge: 2019-09-25 | Disposition: A | Discharge status: Home / Self Care | Payer: PRIVATE HEALTH INSURANCE | Source: Ambulatory Visit | Attending: Family Medicine | Admitting: Family Medicine

## 2019-09-25 VITALS — BP 110/72 | HR 93 | Temp 98.8°F | Resp 12 | Ht 62.0 in | Wt 211.4 lb

## 2019-09-25 DIAGNOSIS — E282 Polycystic ovarian syndrome: Secondary | ICD-10-CM | POA: Insufficient documentation

## 2019-09-25 DIAGNOSIS — J4521 Mild intermittent asthma with (acute) exacerbation: Secondary | ICD-10-CM

## 2019-09-25 DIAGNOSIS — Z113 Encounter for screening for infections with a predominantly sexual mode of transmission: Secondary | ICD-10-CM

## 2019-09-25 DIAGNOSIS — E669 Obesity, unspecified: Secondary | ICD-10-CM | POA: Insufficient documentation

## 2019-09-25 DIAGNOSIS — Z30013 Encounter for initial prescription of injectable contraceptive: Secondary | ICD-10-CM

## 2019-09-25 DIAGNOSIS — Z131 Encounter for screening for diabetes mellitus: Secondary | ICD-10-CM

## 2019-09-25 DIAGNOSIS — E66812 Body mass index (BMI) 38.0-38.9, adult: Secondary | ICD-10-CM

## 2019-09-25 DIAGNOSIS — A6000 Herpesviral infection of urogenital system, unspecified: Secondary | ICD-10-CM

## 2019-09-25 DIAGNOSIS — D509 Iron deficiency anemia, unspecified: Secondary | ICD-10-CM

## 2019-09-25 DIAGNOSIS — Z6838 Body mass index (BMI) 38.0-38.9, adult: Secondary | ICD-10-CM | POA: Insufficient documentation

## 2019-09-25 LAB — CBC
HCT: 41.3 % (ref 36.0–46.0)
Hemoglobin: 13.9 g/dL (ref 12.0–15.0)
MCHC: 33.6 g/dL (ref 30.0–36.0)
MCV: 83.9 fl (ref 78.0–100.0)
Platelets: 437 10*3/uL — ABNORMAL HIGH (ref 150.0–400.0)
RBC: 4.93 Mil/uL (ref 3.87–5.11)
RDW: 14.5 % (ref 11.5–15.5)
WBC: 10.4 10*3/uL (ref 4.0–10.5)

## 2019-09-25 LAB — HEMOGLOBIN A1C: Hgb A1c MFr Bld: 5 % (ref 4.6–6.5)

## 2019-09-25 LAB — POCT URINE PREGNANCY: Preg Test, Ur: NEGATIVE

## 2019-09-25 MED ORDER — PREDNISONE 20 MG PO TABS: 40.0000 mg | ORAL_TABLET | Freq: Every day | ORAL | 0 refills | Status: AC

## 2019-09-25 MED ORDER — MEDROXYPROGESTERONE ACETATE 150 MG/ML IM SUSP
150.0000 mg | Freq: Once | INTRAMUSCULAR | Status: AC
  Administered 2019-09-25: 11:00:00 150 mg via INTRAMUSCULAR

## 2019-09-25 MED ORDER — VALACYCLOVIR HCL 500 MG PO TABS: ORAL_TABLET | ORAL | 0 refills | Status: DC

## 2019-09-25 NOTE — Patient Instructions (Addendum)
A few things to remember from today's visit:  Depo Provera today. No changes in asthma meds. Prednisone to take with food, breakfast, x 3 days.  Start a food diary.  Please be sure medication list is accurate. If a new problem present, please set up appointment sooner than planned today.

## 2019-09-25 NOTE — Progress Notes (Signed)
HPI:  Catherine Lane is a 24 y.o. female, who is here today to establish care.  Former PCP: N/A Last preventive routine visit: 2018. Last gyn routine visit 08/02/18  Chronic medical problems: Asthma,atopic dermatitis,anemia,anxiety,OCD, depression, and eczema among some.  Hx of PCOS. More regular now, used to have months without menstrual periods. Occasionally misses one.  Hx of anemia. She is not on iron supplementation. Heavy menses ,worse when she was on OCP's.  Lab Results  Component Value Date   WBC 8.6 06/21/2016   HGB 13.7 06/21/2016   HCT 41.1 06/21/2016   MCV 80.9 06/21/2016   PLT 412 (H) 06/21/2016   Asthma: She is on Albuterol inh. 1-2 exacerbations per year. Recently in the ER due to asthma exacerbation. Still having mild wheezing,no cough or SOB. Seasonal changes exacerbate symptoms. Negative for nocturnal symptom.  Concerns today: Requesting STD testing. Denies risk factors. One sex partner. She has not not noted vaginal discharge or pelvic pain.  She does not use condoms. She is not on birth control. She was on OCP's until a year ago,she would like to try something different.  In general she follows a healthful diet and exercises regularly. She goes to the gym 2-3 times per week and exercises for an hour.  Also asking for refills on Valtrex. Hx of recurrent genital herpes, started 2 years ago. Sometimes flare ups are caused by menstrual periods. She has about 4-5 episodes per year.  Review of Systems  Constitutional: Negative for activity change, appetite change, fatigue and fever.  HENT: Negative for mouth sores, nosebleeds and sore throat.   Eyes: Negative for redness and visual disturbance.  Cardiovascular: Negative for chest pain, palpitations and leg swelling.  Gastrointestinal: Negative for abdominal pain, nausea and vomiting.       Negative for changes in bowel habits.  Genitourinary: Negative for decreased urine volume,  dyspareunia, dysuria, hematuria and vaginal bleeding.  Musculoskeletal: Negative for gait problem and myalgias.  Skin: Negative for rash and wound.  Allergic/Immunologic: Positive for environmental allergies.  Neurological: Negative for syncope, weakness and headaches.  Psychiatric/Behavioral: Negative for confusion and hallucinations.  Rest see pertinent positives and negatives per HPI.  Current Outpatient Medications on File Prior to Visit  Medication Sig Dispense Refill  . albuterol (PROVENTIL) (5 MG/ML) 0.5% nebulizer solution Take 0.5 mLs (2.5 mg total) by nebulization every 6 (six) hours as needed for wheezing or shortness of breath. 20 mL 2  . albuterol (VENTOLIN HFA) 108 (90 Base) MCG/ACT inhaler Inhale 2 puffs into the lungs every 4 (four) hours as needed for wheezing or shortness of breath. 18 g 1  . Nebulizer MISC 1 nebulizer machine for home use 1 each 0  . sodium fluoride (PREVIDENT) 1.1 % GEL dental gel PreviDent 5000 Booster Plus 1.1 % dental paste     No current facility-administered medications on file prior to visit.   Past Medical History:  Diagnosis Date  . Anemia   . Anxiety   . Depression   . OCD (obsessive compulsive disorder)    Allergies  Allergen Reactions  . Lane Tree Pollen [Corylus] Anaphylaxis     Hazelnuts     Family History  Problem Relation Age of Onset  . Asthma Father   . Hyperlipidemia Father   . Hypertension Father   . Diabetes Father   . Hyperlipidemia Paternal Aunt   . Cancer Paternal Uncle   . Diabetes Paternal Uncle   . Hyperlipidemia Paternal Grandmother  Social History   Socioeconomic History  . Marital status: Single    Spouse name: Not on file  . Number of children: Not on file  . Years of education: Not on file  . Highest education level: Not on file  Occupational History  . Not on file  Tobacco Use  . Smoking status: Never Smoker  . Smokeless tobacco: Never Used  Substance and Sexual Activity  . Alcohol use: No     Alcohol/week: 0.0 standard drinks  . Drug use: No  . Sexual activity: Never    Birth control/protection: None  Other Topics Concern  . Not on file  Social History Narrative  . Not on file   Social Determinants of Health   Financial Resource Strain:   . Difficulty of Paying Living Expenses:   Food Insecurity:   . Worried About Charity fundraiser in the Last Year:   . Arboriculturist in the Last Year:   Transportation Needs:   . Film/video editor (Medical):   Marland Kitchen Lack of Transportation (Non-Medical):   Physical Activity:   . Days of Exercise per Week:   . Minutes of Exercise per Session:   Stress:   . Feeling of Stress :   Social Connections:   . Frequency of Communication with Friends and Family:   . Frequency of Social Gatherings with Friends and Family:   . Attends Religious Services:   . Active Member of Clubs or Organizations:   . Attends Archivist Meetings:   Marland Kitchen Marital Status:     Vitals:   09/25/19 0958  BP: 110/72  Pulse: 93  Resp: 12  Temp: 98.8 F (37.1 C)  SpO2: 97%    Body mass index is 38.66 kg/m.  Physical Exam  Nursing note and vitals reviewed. Constitutional: She is oriented to person, place, and time. She appears well-developed. No distress.  HENT:  Head: Normocephalic and atraumatic.  Mouth/Throat: Oropharynx is clear and moist and mucous membranes are normal.  Eyes: Pupils are equal, round, and reactive to light. Conjunctivae are normal.  Cardiovascular: Normal rate and regular rhythm.  No murmur heard. Pulses:      Dorsalis pedis pulses are 2+ on the right side and 2+ on the left side.  Respiratory: Effort normal. No respiratory distress. She has wheezes (minimal).  GI: Soft. She exhibits no mass. There is no hepatomegaly. There is no abdominal tenderness.  Musculoskeletal:        General: No edema.  Lymphadenopathy:    She has no cervical adenopathy.  Neurological: She is alert and oriented to person, place, and  time. She has normal strength. No cranial nerve deficit. Gait normal.  Skin: Skin is warm. No rash noted. No erythema.  Psychiatric: She has a normal mood and affect.  Well groomed, good eye contact.   ASSESSMENT AND PLAN:  Catherine Lane was seen today for establish care.  Diagnoses and all orders for this visit:  Orders Placed This Encounter  Procedures  . HIV antibody (with reflex)  . CBC  . Hemoglobin A1c  . RPR  . POCT urine pregnancy   Lab Results  Component Value Date   HGBA1C 5.0 09/25/2019   Lab Results  Component Value Date   WBC 10.4 09/25/2019   HGB 13.9 09/25/2019   HCT 41.3 09/25/2019   MCV 83.9 09/25/2019   PLT 437.0 (H) 09/25/2019    Mild intermittent asthma with acute exacerbation Recommend short course of oral Prednisone. Some side effects,recommend  taking it with food. Albuterol inh 2 puff every 6 hours prn for wheezing or shortness of breath.   -     predniSONE (DELTASONE) 20 MG tablet; Take 2 tablets (40 mg total) by mouth daily with breakfast for 3 days.  Screen for STD (sexually transmitted disease) STD prevention discussed.  Encounter for initial prescription of injectable contraceptive After discussing options + some pros and cons, she would like to have Depo Provera today while she decides about other type of birth control (IUD,implanon). Pregnancy test today negative.  -     medroxyPROGESTERone (DEPO-PROVERA) injection 150 mg  Diabetes mellitus screening -     Hemoglobin A1c  Iron deficiency anemia, unspecified iron deficiency anemia type Further recommendations according to CBC results.  Recurrent genital herpes No changes in current management.  -     valACYclovir (VALTREX) 500 MG tablet; 1 tab every 12 hours for 3 days prn for flare ups,asap.  PCOS (polycystic ovarian syndrome) We discussed dx and treatment options. She is having more regular menses.  Class 2 severe obesity with serious comorbidity and body mass index (BMI) of 38.0 to  38.9 in adult, unspecified obesity type (HCC) We discussed benefits of wt loss as well as adverse effects of obesity. Encouraged to continue a healthful diet and regular physical activity. Label check,calories count,and food diary.   Return in about 3 months (around 12/25/2019).   Betty G. [place], MD  [facility]. [place] office.  A few things to remember from today's visit:  Depo Provera today. No changes in asthma meds. Prednisone to take with food, breakfast, x 3 days.  Start a food diary.  Please be sure medication list is accurate. If a new problem present, please set up appointment sooner than planned today.

## 2019-09-26 LAB — URINE CYTOLOGY ANCILLARY ONLY
Chlamydia: NEGATIVE
Comment: NEGATIVE
Comment: NEGATIVE
Comment: NORMAL
Neisseria Gonorrhea: NEGATIVE
Trichomonas: NEGATIVE

## 2019-09-26 LAB — RPR: RPR Ser Ql: NONREACTIVE

## 2019-09-26 LAB — HIV ANTIBODY (ROUTINE TESTING W REFLEX): HIV 1&2 Ab, 4th Generation: NONREACTIVE

## 2019-10-02 ENCOUNTER — Encounter: Payer: Self-pay | Admitting: Family Medicine

## 2019-10-05 ENCOUNTER — Ambulatory Visit (HOSPITAL_COMMUNITY)
Admission: EM | Admit: 2019-10-05 | Discharge: 2019-10-05 | Disposition: A | Discharge status: Home / Self Care | Payer: BC Managed Care – PPO | Attending: Urgent Care | Admitting: Urgent Care

## 2019-10-05 ENCOUNTER — Other Ambulatory Visit: Payer: Self-pay

## 2019-10-05 ENCOUNTER — Encounter (HOSPITAL_COMMUNITY): Payer: Self-pay

## 2019-10-05 DIAGNOSIS — J020 Streptococcal pharyngitis: Secondary | ICD-10-CM

## 2019-10-05 DIAGNOSIS — J029 Acute pharyngitis, unspecified: Secondary | ICD-10-CM

## 2019-10-05 LAB — POCT RAPID STREP A: Streptococcus, Group A Screen (Direct): POSITIVE — AB

## 2019-10-05 MED ORDER — AMOXICILLIN 500 MG PO CAPS: 500.0000 mg | ORAL_CAPSULE | Freq: Two times a day (BID) | ORAL | 0 refills | Status: DC

## 2019-10-05 MED ORDER — IBUPROFEN 600 MG PO TABS: 600.0000 mg | ORAL_TABLET | Freq: Three times a day (TID) | ORAL | 0 refills | Status: DC | PRN

## 2019-10-05 NOTE — ED Provider Notes (Signed)
[facility]   MRN: [id number] DOB: [birth year]  Subjective:   Dalila L Kathleene Hazel is a 24 y.o. female presenting for 1 week history of persistent throat pain.  No current facility-administered medications for this encounter.  Current Outpatient Medications:  .  albuterol (PROVENTIL) (5 MG/ML) 0.5% nebulizer solution, Take 0.5 mLs (2.5 mg total) by nebulization every 6 (six) hours as needed for wheezing or shortness of breath., Disp: 20 mL, Rfl: 2 .  albuterol (VENTOLIN HFA) 108 (90 Base) MCG/ACT inhaler, Inhale 2 puffs into the lungs every 4 (four) hours as needed for wheezing or shortness of breath., Disp: 18 g, Rfl: 1 .  Nebulizer MISC, 1 nebulizer machine for home use, Disp: 1 each, Rfl: 0 .  sodium fluoride (PREVIDENT) 1.1 % GEL dental gel, PreviDent 5000 Booster Plus 1.1 % dental paste, Disp: , Rfl:  .  valACYclovir (VALTREX) 500 MG tablet, 1 tab every 12 hours for 3 days prn for flare ups,asap., Disp: 30 tablet, Rfl: 0     Past Medical History:  Diagnosis Date  . Anemia   . Anxiety   . Depression   . OCD (obsessive compulsive disorder)      History reviewed. No pertinent surgical history.  Family History  Problem Relation Age of Onset  . Asthma Father   . Hyperlipidemia Father   . Hypertension Father   . Diabetes Father   . Hyperlipidemia Paternal Aunt   . Cancer Paternal Uncle   . Diabetes Paternal Uncle   . Hyperlipidemia Paternal Grandmother     Social History   Tobacco Use  . Smoking status: Never Smoker  . Smokeless tobacco: Never Used  Substance Use Topics  . Alcohol use: No    Alcohol/week: 0.0 standard drinks  . Drug use: No    Review of Systems  Constitutional: Negative for fever and malaise/fatigue.  HENT: Positive for sore throat. Negative for congestion, ear pain and sinus pain.   Eyes: Negative for blurred vision, double vision, discharge and redness.  Respiratory: Negative for cough, hemoptysis, shortness of breath and wheezing.    Cardiovascular: Negative for chest pain.  Gastrointestinal: Negative for abdominal pain, diarrhea, nausea and vomiting.  Genitourinary: Negative for dysuria, flank pain and hematuria.  Musculoskeletal: Negative for myalgias.  Skin: Negative for rash.  Neurological: Negative for dizziness, weakness and headaches.  Psychiatric/Behavioral: Negative for depression and substance abuse.     Objective:   Vitals: BP 119/88 (BP Location: Right Arm)   Pulse 99   Temp 99.2 F (37.3 C) (Oral)   Resp 16   LMP 09/03/2019 (Exact Date)   SpO2 99%   Physical Exam Constitutional:      General: She is not in acute distress.    Appearance: Normal appearance. She is well-developed. She is not ill-appearing, toxic-appearing or diaphoretic.  HENT:     Head: Normocephalic and atraumatic.     Nose: Nose normal.     Mouth/Throat:     Mouth: Mucous membranes are moist.     Pharynx: Oropharyngeal exudate (R>L) and posterior oropharyngeal erythema (R>L) present.     Tonsils: 1+ on the right. 1+ on the left.  Eyes:     General: No scleral icterus.    Extraocular Movements: Extraocular movements intact.     Pupils: Pupils are equal, round, and reactive to light.  Cardiovascular:     Rate and Rhythm: Normal rate.  Pulmonary:     Effort: Pulmonary effort is normal.  Skin:    General: Skin  is warm and dry.  Neurological:     General: No focal deficit present.     Mental Status: She is alert and oriented to person, place, and time.  Psychiatric:        Mood and Affect: Mood normal.        Behavior: Behavior normal.     Results for orders placed or performed during the hospital encounter of 10/05/19 (from the past 24 hour(s))  POCT rapid strep A [facility])     Status: Abnormal   Collection Time: 10/05/19  7:23 PM  Result Value Ref Range   Streptococcus, Group A Screen (Direct) POSITIVE (A) NEGATIVE    Assessment and Plan :   PDMP not reviewed this encounter.  1. Sore throat   2.  Strep pharyngitis     Will treat for strep pharyngitis.  Patient is to start amoxicillin, use supportive care otherwise. Counseled patient on potential for adverse effects with medications prescribed/recommended today, ER and return-to-clinic precautions discussed, patient verbalized understanding.    Jaynee Eagles, [place] 10/05/19 1931

## 2019-10-05 NOTE — ED Triage Notes (Signed)
Pt reports sore throat x 1 week. Pt states the sore throat improved over the week without medications.

## 2019-12-11 ENCOUNTER — Encounter (HOSPITAL_COMMUNITY): Payer: Self-pay

## 2019-12-11 ENCOUNTER — Ambulatory Visit (HOSPITAL_COMMUNITY)
Admission: EM | Admit: 2019-12-11 | Discharge: 2019-12-11 | Disposition: A | Discharge status: Home / Self Care | Payer: BC Managed Care – PPO | Attending: Family Medicine | Admitting: Family Medicine

## 2019-12-11 ENCOUNTER — Other Ambulatory Visit: Payer: Self-pay

## 2019-12-11 DIAGNOSIS — R079 Chest pain, unspecified: Secondary | ICD-10-CM

## 2019-12-11 HISTORY — DX: Unspecified asthma, uncomplicated: J45.909

## 2019-12-11 HISTORY — DX: Polycystic ovarian syndrome: E28.2

## 2019-12-11 NOTE — ED Provider Notes (Signed)
[facility]    CSN: [id number] Arrival date & time: 12/11/19  1948      History   Chief Complaint Chief Complaint  Patient presents with  . Chest Pain    HPI Jeslie L Kathleene Hazel is a 24 y.o. female.   She is presenting with left-sided chest pain.  Happened earlier today.  She has been under more stress with work.  Denies a history of any chest surgery.  She does have reflux.  Has not tried any medications.  HPI  Past Medical History:  Diagnosis Date  . Anemia   . Anxiety   . Asthma   . Depression   . OCD (obsessive compulsive disorder)   . PCOS (polycystic ovarian syndrome)     Patient Active Problem List   Diagnosis Date Noted  . Recurrent genital herpes 09/25/2019  . PCOS (polycystic ovarian syndrome) 09/25/2019  . Class 2 obesity with body mass index (BMI) of 38.0 to 38.9 in adult 09/25/2019  . Headache 02/10/2015  . Asthma, mild intermittent 07/30/2014  . Irregular periods 07/30/2014    History reviewed. No pertinent surgical history.  OB History    Gravida  0   Para  0   Term  0   Preterm  0   AB  0   Living  0     SAB  0   TAB  0   Ectopic  0   Multiple  0   Live Births               Home Medications    Prior to Admission medications   Medication Sig Start Date End Date Taking? Authorizing Provider  albuterol (PROVENTIL) (5 MG/ML) 0.5% nebulizer solution Take 0.5 mLs (2.5 mg total) by nebulization every 6 (six) hours as needed for wheezing or shortness of breath. 09/22/19  Yes Garlon Hatchet, PA-C  albuterol (VENTOLIN HFA) 108 (90 Base) MCG/ACT inhaler Inhale 2 puffs into the lungs every 4 (four) hours as needed for wheezing or shortness of breath. 09/22/19   Garlon Hatchet, PA-C  ibuprofen (ADVIL) 600 MG tablet Take 1 tablet (600 mg total) by mouth every 8 (eight) hours as needed. 10/05/19   Wallis Bamberg, PA-C  medroxyPROGESTERone (DEPO-PROVERA) 150 MG/ML injection Inject 150 mg into the muscle every 3 (three) months.     [provider]  Nebulizer MISC 1 nebulizer machine for home use 09/22/19   Garlon Hatchet, PA-C  sodium fluoride (PREVIDENT) 1.1 % GEL dental gel PreviDent 5000 Booster Plus 1.1 % dental paste    [provider]  valACYclovir (VALTREX) 500 MG tablet 1 tab every 12 hours for 3 days prn for flare ups,asap. 09/25/19   [place], Betty G, MD    Family History Family History  Problem Relation Age of Onset  . Asthma Father   . Hyperlipidemia Father   . Hypertension Father   . Diabetes Father   . Hyperlipidemia Paternal Aunt   . Cancer Paternal Uncle   . Diabetes Paternal Uncle   . Hyperlipidemia Paternal Grandmother     Social History Social History   Tobacco Use  . Smoking status: Never Smoker  . Smokeless tobacco: Never Used  Substance Use Topics  . Alcohol use: No    Alcohol/week: 0.0 standard drinks  . Drug use: No     Allergies   Hazel tree pollen [corylus]   Review of Systems Review of Systems  See HPI  Physical Exam Triage Vital Signs ED  Triage Vitals  Enc Vitals Group     BP 12/11/19 2035 128/60     Pulse Rate 12/11/19 2035 98     Resp 12/11/19 2035 (!) 22     Temp 12/11/19 2035 98.9 F (37.2 C)     Temp Source 12/11/19 2035 Oral     SpO2 12/11/19 2035 100 %     Weight --      Height --      Head Circumference --      Peak Flow --      Pain Score 12/11/19 2034 3     Pain Loc --      Pain Edu? --      Excl. in GC? --    No data found.  Updated Vital Signs BP 128/60 (BP Location: Right Arm)   Pulse 98   Temp 98.9 F (37.2 C) (Oral)   Resp (!) 22   SpO2 100%   Visual Acuity Right Eye Distance:   Left Eye Distance:   Bilateral Distance:    Right Eye Near:   Left Eye Near:    Bilateral Near:     Physical Exam Gen: NAD, alert, cooperative with exam, well-appearing Eye: normal EOM, normal conjunctiva and lids CV: regular rate and rhythm, S1-S2   Resp: no accessory muscle use, non-labored, clear to auscultation bilaterally,  no crackles or wheezes Skin: no rashes, no areas of induration  Neuro: normal tone, normal sensation to touch Psych:  normal insight, alert and oriented MSK: Normal gait, normal strength    UC Treatments / Results  Labs (all labs ordered are listed, but only abnormal results are displayed) Labs Reviewed - No data to display  EKG EKG interpretation: Normal sinus rhythm  Radiology No results found.  Procedures Procedures (including critical care time)  Medications Ordered in UC Medications - No data to display  Initial Impression / Assessment and Plan / UC Course  I have reviewed the triage vital signs and the nursing notes.  Pertinent labs & imaging results that were available during my care of the patient were reviewed by me and considered in my medical decision making (see chart for details).     Ms. Felipa Furnace is a 24 year old female is presenting with left-sided chest pain.  EKG was reassuring.  Counseled on supportive care.  May be related reflux.  Counseled on over-the-counter medications to try.  Given indications to follow-up.  Final Clinical Impressions(s) / UC Diagnoses   Final diagnoses:  Nonspecific chest pain     Discharge Instructions     Please try omeprazole to see if that improves your symptoms  Please follow up if your symptoms fail to improve.     ED Prescriptions    None     PDMP not reviewed this encounter.   Myra Rude, MD 12/11/19 2119

## 2019-12-11 NOTE — Discharge Instructions (Signed)
Please try omeprazole to see if that improves your symptoms  Please follow up if your symptoms fail to improve.

## 2019-12-11 NOTE — ED Triage Notes (Signed)
Pt c/o acute left CP 3/10 radiating to left arm/jaw while driving tonight at approx 1930. Pt states she has been under a lot of stress and was leaving a stressful job when CP started. Pt states she experienced mild SOB, but also states h/o asthma, took her albuterol at 1945 (2 puffs) then another 2 puffs at 2015.   Denies n/v, diaphoresis, blurred vision, extremity numbness.  EKG performed results to provider

## 2020-02-12 DIAGNOSIS — L218 Other seborrheic dermatitis: Secondary | ICD-10-CM | POA: Diagnosis not present

## 2020-02-12 DIAGNOSIS — L2084 Intrinsic (allergic) eczema: Secondary | ICD-10-CM | POA: Diagnosis not present

## 2020-02-20 ENCOUNTER — Other Ambulatory Visit: Payer: Self-pay

## 2020-02-20 ENCOUNTER — Ambulatory Visit: Payer: BC Managed Care – PPO | Admitting: Family Medicine

## 2020-02-20 ENCOUNTER — Ambulatory Visit (INDEPENDENT_AMBULATORY_CARE_PROVIDER_SITE_OTHER): Payer: BC Managed Care – PPO

## 2020-02-20 DIAGNOSIS — Z3042 Encounter for surveillance of injectable contraceptive: Secondary | ICD-10-CM | POA: Diagnosis not present

## 2020-02-20 LAB — POCT URINE PREGNANCY: Preg Test, Ur: NEGATIVE

## 2020-02-20 MED ORDER — MEDROXYPROGESTERONE ACETATE 150 MG/ML IM SUSP
150.0000 mg | Freq: Once | INTRAMUSCULAR | Status: AC
  Administered 2020-02-20: 150 mg via INTRAMUSCULAR

## 2020-02-20 NOTE — Progress Notes (Signed)
Patient presented for her Depo Provera Injection. Injection is late, so POCT urine pregnancy was obtained & resulted as negative. Injection given in the left upper outer quadrant, patient tolerated injection well. Given dates of next injection that is due in December.

## 2020-03-25 DIAGNOSIS — L218 Other seborrheic dermatitis: Secondary | ICD-10-CM | POA: Diagnosis not present

## 2020-03-25 DIAGNOSIS — L2084 Intrinsic (allergic) eczema: Secondary | ICD-10-CM | POA: Diagnosis not present

## 2020-03-25 DIAGNOSIS — D2372 Other benign neoplasm of skin of left lower limb, including hip: Secondary | ICD-10-CM | POA: Diagnosis not present

## 2020-04-01 ENCOUNTER — Other Ambulatory Visit: Payer: Self-pay

## 2020-04-01 ENCOUNTER — Encounter: Payer: Self-pay | Admitting: Family Medicine

## 2020-04-01 ENCOUNTER — Ambulatory Visit: Payer: BC Managed Care – PPO | Admitting: Family Medicine

## 2020-04-01 VITALS — BP 114/72 | HR 106 | Temp 97.5°F | Ht 62.0 in | Wt 120.5 lb

## 2020-04-01 DIAGNOSIS — Z23 Encounter for immunization: Secondary | ICD-10-CM | POA: Diagnosis not present

## 2020-04-01 DIAGNOSIS — N644 Mastodynia: Secondary | ICD-10-CM

## 2020-04-01 DIAGNOSIS — R0781 Pleurodynia: Secondary | ICD-10-CM | POA: Diagnosis not present

## 2020-04-01 LAB — POCT URINE PREGNANCY: Preg Test, Ur: NEGATIVE

## 2020-04-01 NOTE — Patient Instructions (Signed)
A few things to remember from today's visit:   Costal margin pain - Plan: CBC, COMPLETE METABOLIC PANEL WITH GFR  Nipple pain - Plan: TSH, POCT urine pregnancy, Prolactin, CBC, COMPLETE METABOLIC PANEL WITH GFR  For now I do not think imaging is needed. Monitor for new symptoms. If labs are normal,I will release them,so you ca see it. If abnormal you will receive a message or phone call.  If you need refills please call your pharmacy. Do not use My Chart to request refills or for acute issues that need immediate attention.    Please be sure medication list is accurate. If a new problem present, please set up appointment sooner than planned today.

## 2020-04-01 NOTE — Progress Notes (Signed)
Chief Complaint  Patient presents with  . Abdominal Pain   HPI: Catherine Lane is a 24 y.o. female, who is here today complaining of right rib cage pain. 5 days ago she had an episode of sudden onset of sharp pain on anterior aspect of right rib cage. It happens when she was sitting up from supine position in bed. Pain lasted about 5-10 min, it was exacerbated by movement. She has not had similar episodes in the past. No associated nausea,vomitong,changes in bowel habits,or skin rash. No hx of trauma or unusual activities. She did not take OTC medications. Pain has not reoccurred.  She is also c/o sharp pain in right nipple that started 5 days ago. Problem has been intermittent, mild. No hx of trauma. She has not noted breast masses,skin changes,or nipple discharge. Stable. FHx for breast or ovarian cancer negative. She has not tried OTC medications.  She is sexually active. She is on Depo Provera 150 mg q 3 months for birth control. She has not noted pelvic pain,vaginal bleeding,or discharge.  Review of Systems  Constitutional: Negative for activity change, appetite change, fatigue and fever.  HENT: Negative for mouth sores, sore throat and trouble swallowing.   Respiratory: Negative for cough, chest tightness, shortness of breath and wheezing.   Genitourinary: Negative for decreased urine volume, dysuria and hematuria.  Musculoskeletal: Negative for back pain and joint swelling.  Neurological: Negative for syncope and weakness.  Rest see pertinent positives and negatives per HPI.  Current Outpatient Medications on File Prior to Visit  Medication Sig Dispense Refill  . albuterol (PROVENTIL) (5 MG/ML) 0.5% nebulizer solution Take 0.5 mLs (2.5 mg total) by nebulization every 6 (six) hours as needed for wheezing or shortness of breath. 20 mL 2  . albuterol (VENTOLIN HFA) 108 (90 Base) MCG/ACT inhaler Inhale 2 puffs into the lungs every 4 (four) hours as needed for  wheezing or shortness of breath. 18 g 1  . ibuprofen (ADVIL) 600 MG tablet Take 1 tablet (600 mg total) by mouth every 8 (eight) hours as needed. 30 tablet 0  . medroxyPROGESTERone (DEPO-PROVERA) 150 MG/ML injection Inject 150 mg into the muscle every 3 (three) months.    . Nebulizer MISC 1 nebulizer machine for home use 1 each 0  . sodium fluoride (PREVIDENT) 1.1 % GEL dental gel PreviDent 5000 Booster Plus 1.1 % dental paste    . valACYclovir (VALTREX) 500 MG tablet 1 tab every 12 hours for 3 days prn for flare ups,asap. 30 tablet 0   No current facility-administered medications on file prior to visit.   Past Medical History:  Diagnosis Date  . Anemia   . Anxiety   . Asthma   . Depression   . OCD (obsessive compulsive disorder)   . PCOS (polycystic ovarian syndrome)    Allergies  Allergen Reactions  . Corylus Anaphylaxis    ** Hazelnuts  ** Hazelnuts      Social History   Socioeconomic History  . Marital status: Single    Spouse name: Not on file  . Number of children: Not on file  . Years of education: Not on file  . Highest education level: Not on file  Occupational History  . Not on file  Tobacco Use  . Smoking status: Never Smoker  . Smokeless tobacco: Never Used  Substance and Sexual Activity  . Alcohol use: No    Alcohol/week: 0.0 standard drinks  . Drug use: No  . Sexual activity: Never  Birth control/protection: None  Other Topics Concern  . Not on file  Social History Narrative  . Not on file   Social Determinants of Health   Financial Resource Strain:   . Difficulty of Paying Living Expenses: Not on file  Food Insecurity:   . Worried About Programme researcher, broadcasting/film/video in the Last Year: Not on file  . Ran Out of Food in the Last Year: Not on file  Transportation Needs:   . Lack of Transportation (Medical): Not on file  . Lack of Transportation (Non-Medical): Not on file  Physical Activity:   . Days of Exercise per Week: Not on file  . Minutes of  Exercise per Session: Not on file  Stress:   . Feeling of Stress : Not on file  Social Connections:   . Frequency of Communication with Friends and Family: Not on file  . Frequency of Social Gatherings with Friends and Family: Not on file  . Attends Religious Services: Not on file  . Active Member of Clubs or Organizations: Not on file  . Attends Banker Meetings: Not on file  . Marital Status: Not on file   Vitals:   04/01/20 1529  BP: 114/72  Pulse: (!) 106  Temp: (!) 97.5 F (36.4 C)  SpO2: 98%   Body mass index is 22.04 kg/m.  Physical Exam Vitals and nursing note reviewed.  Constitutional:      General: She is not in acute distress.    Appearance: She is well-developed.  HENT:     Head: Normocephalic and atraumatic.  Eyes:     Conjunctiva/sclera: Conjunctivae normal.  Cardiovascular:     Rate and Rhythm: Normal rate and regular rhythm.     Comments: HR: 96/min Pulmonary:     Effort: Pulmonary effort is normal. No respiratory distress.     Breath sounds: Normal breath sounds.  Chest:     Chest wall: No mass, deformity, tenderness or edema.     Breasts:        Right: No inverted nipple, mass, nipple discharge, skin change or tenderness.        Left: No inverted nipple, mass, nipple discharge, skin change or tenderness.  Abdominal:     Palpations: Abdomen is soft. There is no hepatomegaly or mass.     Tenderness: There is no abdominal tenderness. There is no right CVA tenderness or left CVA tenderness.  Genitourinary:    Comments: There is no nipple pain with palpation. Lymphadenopathy:     Cervical: No cervical adenopathy.     Upper Body:     Right upper body: No supraclavicular or axillary adenopathy.     Left upper body: No supraclavicular or axillary adenopathy.  Skin:    General: Skin is warm.     Findings: No ecchymosis, erythema or rash.  Neurological:     Mental Status: She is alert and oriented to person, place, and time.  Psychiatric:         Speech: Speech normal.     Comments: Well groomed, good eye contact.   ASSESSMENT AND PLAN:  Catherine Lane was seen today for abdominal pain.  Diagnoses and all orders for this visit: Orders Placed This Encounter  Procedures  . Flu Vaccine QUAD 36+ mos IM  . TSH  . Prolactin  . CBC  . COMPLETE METABOLIC PANEL WITH GFR  . POCT urine pregnancy   Lab Results  Component Value Date   TSH 0.78 04/01/2020   Lab Results  Component  Value Date   WBC 9.9 04/01/2020   HGB 14.2 04/01/2020   HCT 42.2 04/01/2020   MCV 82.7 04/01/2020   PLT 473 (H) 04/01/2020   Lab Results  Component Value Date   CREATININE 0.82 04/01/2020   BUN 8 04/01/2020   NA 137 04/01/2020   K 4.0 04/01/2020   CL 102 04/01/2020   CO2 26 04/01/2020   Lab Results  Component Value Date   ALT 20 04/01/2020   AST 18 04/01/2020   BILITOT 0.5 04/01/2020    Costal margin pain Resolved. We discussed possible causes, musculoskeletal most likely. I do not think imaging is needed at this time. Monitor for recurrence.  Nipple pain Hx and examination today do not suggest a serious process. ? hormonal related. Recommend continue monitoring for changes and/or new symptoms. Further recommendations according to blood work results.  Need for influenza vaccination -     Flu Vaccine QUAD 36+ mos IM   Return if symptoms worsen or fail to improve.   Belina Mandile G. [place], MD  [facility]. [place] office.   A few things to remember from today's visit:   Costal margin pain - Plan: CBC, COMPLETE METABOLIC PANEL WITH GFR  Nipple pain - Plan: TSH, POCT urine pregnancy, Prolactin, CBC, COMPLETE METABOLIC PANEL WITH GFR  For now I do not think imaging is needed. Monitor for new symptoms. If labs are normal,I will release them,so you ca see it. If abnormal you will receive a message or phone call.  If you need refills please call your pharmacy. Do not use My Chart to request refills or for acute issues  that need immediate attention.    Please be sure medication list is accurate. If a new problem present, please set up appointment sooner than planned today.

## 2020-04-02 LAB — CBC
HCT: 42.2 % (ref 35.0–45.0)
Hemoglobin: 14.2 g/dL (ref 11.7–15.5)
MCH: 27.8 pg (ref 27.0–33.0)
MCHC: 33.6 g/dL (ref 32.0–36.0)
MCV: 82.7 fL (ref 80.0–100.0)
MPV: 10.1 fL (ref 7.5–12.5)
Platelets: 473 10*3/uL — ABNORMAL HIGH (ref 140–400)
RBC: 5.1 10*6/uL (ref 3.80–5.10)
RDW: 12.6 % (ref 11.0–15.0)
WBC: 9.9 10*3/uL (ref 3.8–10.8)

## 2020-04-02 LAB — COMPLETE METABOLIC PANEL WITH GFR
AG Ratio: 1.3 (calc) (ref 1.0–2.5)
ALT: 20 U/L (ref 6–29)
AST: 18 U/L (ref 10–30)
Albumin: 4.3 g/dL (ref 3.6–5.1)
Alkaline phosphatase (APISO): 60 U/L (ref 31–125)
BUN: 8 mg/dL (ref 7–25)
CO2: 26 mmol/L (ref 20–32)
Calcium: 9.2 mg/dL (ref 8.6–10.2)
Chloride: 102 mmol/L (ref 98–110)
Creat: 0.82 mg/dL (ref 0.50–1.10)
GFR, Est African American: 116 mL/min/{1.73_m2} (ref >=60)
GFR, Est Non African American: 100 mL/min/{1.73_m2} (ref >=60)
Globulin: 3.2 g/dL (calc) (ref 1.9–3.7)
Glucose, Bld: 67 mg/dL (ref 65–99)
Potassium: 4 mmol/L (ref 3.5–5.3)
Sodium: 137 mmol/L (ref 135–146)
Total Bilirubin: 0.5 mg/dL (ref 0.2–1.2)
Total Protein: 7.5 g/dL (ref 6.1–8.1)

## 2020-04-02 LAB — TSH: TSH: 0.78 mIU/L

## 2020-04-02 LAB — PROLACTIN: Prolactin: 9.7 ng/mL

## 2020-05-21 ENCOUNTER — Ambulatory Visit: Payer: BC Managed Care – PPO

## 2020-05-22 ENCOUNTER — Ambulatory Visit (INDEPENDENT_AMBULATORY_CARE_PROVIDER_SITE_OTHER): Payer: BC Managed Care – PPO | Admitting: *Deleted

## 2020-05-22 ENCOUNTER — Other Ambulatory Visit: Payer: Self-pay

## 2020-05-22 DIAGNOSIS — Z3042 Encounter for surveillance of injectable contraceptive: Secondary | ICD-10-CM | POA: Diagnosis not present

## 2020-05-22 DIAGNOSIS — Z3200 Encounter for pregnancy test, result unknown: Secondary | ICD-10-CM

## 2020-05-22 LAB — POCT URINE PREGNANCY: Preg Test, Ur: NEGATIVE

## 2020-05-22 MED ORDER — METHYLPREDNISOLONE ACETATE 80 MG/ML IJ SUSP: 80.0000 mg | Freq: Once | INTRAMUSCULAR | Status: DC

## 2020-05-22 NOTE — Progress Notes (Signed)
Per orders of Dr. Clent Ridges, injection of Medroxyprogesterone acetate 150mg  given by . Patient tolerated injection well.  Urine pregnancy test completed with negative result as patient was past the calendar date for the injection.

## 2020-06-05 DIAGNOSIS — Z3042 Encounter for surveillance of injectable contraceptive: Secondary | ICD-10-CM

## 2020-06-05 MED ORDER — MEDROXYPROGESTERONE ACETATE 150 MG/ML IM SUSP
150.0000 mg | Freq: Once | INTRAMUSCULAR | Status: AC
  Administered 2020-06-05: 150 mg via INTRAMUSCULAR

## 2020-06-05 NOTE — Progress Notes (Signed)
Order re-entered as initially ordered incorrectly as Depo Medrol.

## 2020-06-05 NOTE — Addendum Note (Signed)
Addended by: Johnella Moloney on: 06/05/2020 03:27 PM   Modules accepted: Orders

## 2020-06-09 ENCOUNTER — Other Ambulatory Visit: Payer: Self-pay

## 2020-07-02 ENCOUNTER — Telehealth: Payer: Self-pay | Admitting: Family Medicine

## 2020-07-02 DIAGNOSIS — A6000 Herpesviral infection of urogenital system, unspecified: Secondary | ICD-10-CM

## 2020-07-02 NOTE — Telephone Encounter (Signed)
Pt call and stated she need a refill on  valACYclovir (VALTREX) 500 MG tablet sent to  [id number] - [place], [place] - [address] AT [facility] OF [address] Phone:  [phone]  Fax:  [phone]

## 2020-07-03 ENCOUNTER — Other Ambulatory Visit: Payer: Self-pay | Admitting: *Deleted

## 2020-07-03 NOTE — Telephone Encounter (Signed)
Patient called back due to no refill being called in yet.

## 2020-07-04 MED ORDER — VALACYCLOVIR HCL 500 MG PO TABS: ORAL_TABLET | ORAL | 0 refills | Status: DC

## 2020-07-04 NOTE — Telephone Encounter (Signed)
Rx sent in

## 2020-07-09 ENCOUNTER — Telehealth (HOSPITAL_COMMUNITY): Payer: Self-pay | Admitting: Clinical

## 2020-07-16 ENCOUNTER — Ambulatory Visit (HOSPITAL_COMMUNITY): Payer: Self-pay | Admitting: Clinical

## 2020-07-17 ENCOUNTER — Other Ambulatory Visit: Payer: Self-pay

## 2020-07-17 ENCOUNTER — Ambulatory Visit (INDEPENDENT_AMBULATORY_CARE_PROVIDER_SITE_OTHER): Payer: BC Managed Care – PPO | Admitting: Clinical

## 2020-07-17 DIAGNOSIS — F331 Major depressive disorder, recurrent, moderate: Secondary | ICD-10-CM | POA: Diagnosis not present

## 2020-07-17 DIAGNOSIS — F419 Anxiety disorder, unspecified: Secondary | ICD-10-CM

## 2020-07-17 DIAGNOSIS — Z87828 Personal history of other (healed) physical injury and trauma: Secondary | ICD-10-CM | POA: Diagnosis not present
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
## 2020-07-21 ENCOUNTER — Other Ambulatory Visit: Payer: Self-pay

## 2020-07-28 DIAGNOSIS — F331 Major depressive disorder, recurrent, moderate: Secondary | ICD-10-CM | POA: Diagnosis not present

## 2020-07-28 DIAGNOSIS — F411 Generalized anxiety disorder: Secondary | ICD-10-CM | POA: Diagnosis not present

## 2020-07-31 ENCOUNTER — Ambulatory Visit (INDEPENDENT_AMBULATORY_CARE_PROVIDER_SITE_OTHER): Payer: BC Managed Care – PPO | Admitting: Clinical

## 2020-07-31 ENCOUNTER — Other Ambulatory Visit: Payer: Self-pay

## 2020-07-31 DIAGNOSIS — F419 Anxiety disorder, unspecified: Secondary | ICD-10-CM

## 2020-07-31 DIAGNOSIS — F331 Major depressive disorder, recurrent, moderate: Secondary | ICD-10-CM | POA: Diagnosis not present

## 2020-07-31 NOTE — Progress Notes (Signed)
   THERAPIST PROGRESS NOTE  Session Time: 3pm  Participation Level: Active  Behavioral Response: CasualAlert"tired"  Type of Therapy: Individual Therapy  Treatment Goals addressed: Anxiety  Interventions: CBT  Virtual Visit via Video Note  I connected with Catherine Lane on 07/31/20 at  3:00 PM EST by a video enabled telemedicine application and verified that I am speaking with the correct person using two identifiers.  Location: Patient: home Provider: office   I discussed the limitations of evaluation and management by telemedicine and the availability of in person appointments. The patient expressed understanding and agreed to proceed.   I discussed the assessment and treatment plan with the patient. The patient was provided an opportunity to ask questions and all were answered. The patient agreed with the plan and demonstrated an understanding of the instructions.   The patient was advised to call back or seek an in-person evaluation if the symptoms worsen or if the condition fails to improve as anticipated.  I provided 45 minutes of non-face-to-face time during this encounter.  Summary: Catherine Lane is a 25 y.o. female who reports experiencing fatigue. Pt states she received medication management with Dr. Sallyanne Havers at [facility] and was prescribed Prozac and Lorazepam. Since taking the medication pt says she has noticed some improvement in her sleep pattern. Pt reports she recently went on a girls trip that she says she enjoyed.    Suicidal/Homicidal: Pt denies SI/HI no plan to harm self or others reported.  Therapist Response: CSW assessed for changes in daily functioning, mood and behavior. CSW provided pt with information on anxiety and probed for feedback on triggers that cause her anxiety. Pt identified overstimulation and mortality. CSW prompted pt to identify the physical sxs and thoughts that she experiences when she feels anxious. CSW actively listened as  pt identified coping methods she finds beneficial to manage anxiety such as deep breathing exercises, taking a shower, mindfulness exercises.  Plan: Return again in 2 weeks.  Diagnosis: Axis I: major depressive disorder, moderate, recurrent    Anxiety     Axis II: No diagnosis    Suzan Slick, LCSW 07/31/2020

## 2020-08-04 ENCOUNTER — Ambulatory Visit: Payer: BC Managed Care – PPO | Admitting: Family Medicine

## 2020-08-05 ENCOUNTER — Other Ambulatory Visit: Payer: Self-pay

## 2020-08-05 ENCOUNTER — Ambulatory Visit: Payer: BC Managed Care – PPO | Admitting: Family Medicine

## 2020-08-05 ENCOUNTER — Encounter: Payer: Self-pay | Admitting: Family Medicine

## 2020-08-05 ENCOUNTER — Other Ambulatory Visit (HOSPITAL_COMMUNITY)
Admission: RE | Admit: 2020-08-05 | Discharge: 2020-08-05 | Disposition: A | Discharge status: Home / Self Care | Payer: BC Managed Care – PPO | Source: Ambulatory Visit | Attending: Family Medicine | Admitting: Family Medicine

## 2020-08-05 VITALS — BP 118/70 | HR 92 | Temp 97.7°F | Resp 12 | Ht 62.0 in | Wt 210.5 lb

## 2020-08-05 DIAGNOSIS — N938 Other specified abnormal uterine and vaginal bleeding: Secondary | ICD-10-CM | POA: Diagnosis not present

## 2020-08-05 DIAGNOSIS — R102 Pelvic and perineal pain: Secondary | ICD-10-CM | POA: Diagnosis not present

## 2020-08-05 DIAGNOSIS — N939 Abnormal uterine and vaginal bleeding, unspecified: Secondary | ICD-10-CM | POA: Insufficient documentation

## 2020-08-05 LAB — POCT URINE PREGNANCY: Preg Test, Ur: NEGATIVE

## 2020-08-05 MED ORDER — IBUPROFEN 600 MG PO TABS: 600.0000 mg | ORAL_TABLET | Freq: Three times a day (TID) | ORAL | 0 refills | Status: AC | PRN

## 2020-08-05 NOTE — Patient Instructions (Signed)
A few things to remember from today's visit:   Vaginal bleeding - Plan: POCT urine pregnancy, Cervicovaginal ancillary only( [place])  DUB (dysfunctional uterine bleeding)  ? Hormonal. Pregnancy was ruled out today. Monitor for new symptoms. If recurrent ,we need to consider changing birth control and to arrange ultrasound.  Abnormal Uterine Bleeding Abnormal uterine bleeding means bleeding more than usual from your womb (uterus). It can include:  Bleeding between menstrual periods.  Bleeding after sex.  Bleeding that is heavier than normal.  Menstrual periods that last longer than usual.  Bleeding after you have stopped having your menstrual period (menopause). There are many problems that may cause this. You should see a doctor for any kind of bleeding that is not normal. Treatment depends on the cause of the bleeding. Follow these instructions at home: Medicines  Take over-the-counter and prescription medicines only as told by your doctor.  Tell your doctor about other medicines that you take. ? If told by your doctor, stop taking aspirin or medicines that have aspirin in them. These medicines can make you bleed more.  You may be given iron pills to replace iron that your body loses because of this condition. Take them as told by your doctor. Managing constipation If you are taking iron pills, you may have trouble pooping (constipation). To prevent or treat trouble pooping, you may need to:  Drink enough fluid to keep your pee (urine) pale yellow.  Take over-the-counter or prescription medicines.  Eat foods that are high in fiber. These include beans, whole grains, and fresh fruits and vegetables.  Limit foods that are high in fat and sugar. These include fried or sweet foods. General instructions  Watch your condition for any changes.  Do not use tampons, douche, or have sex, if your doctor tells you not to.  Change your pads often.  Get regular exams. This  includes pelvic exams and cervical cancer screenings. ? It is up to you to get the results of any tests that are done. Ask your doctor, or the department that is doing the tests, when your results will be ready.  Keep all follow-up visits as told by your doctor. This is important. Contact a doctor if:  The bleeding lasts more than 1 week.  You feel dizzy at times.  You feel like you may vomit (nausea).  You vomit.  You feel light-headed or weak.  Your symptoms get worse. Get help right away if:  You pass out.  You have to change pads every hour.  You have pain in your belly.  You have a fever or chills.  You get sweaty.  You get weak.  You pass large blood clots from your vagina. Summary  Abnormal uterine bleeding means bleeding more than usual from your womb (uterus).  Any kind of bleeding that is not normal should be checked by a doctor.  Treatment depends on the cause of the bleeding.  Get help right away if you pass out, you have to change pads every hour, or you pass large blood clots from your vagina. This information is not intended to replace advice given to you by your health care provider. Make sure you discuss any questions you have with your health care provider. Document Revised: 03/20/2019 Document Reviewed: 03/20/2019 Elsevier Patient Education  2021 Elsevier Inc.  Please be sure medication list is accurate. If a new problem present, please set up appointment sooner than planned today.

## 2020-08-05 NOTE — Progress Notes (Unsigned)
Chief Complaint  Patient presents with  . Vaginal Bleeding    Started on Friday, pt hasn't had a period while on the Depo shot.    HPI: Ms.Catherine Lane is a 25 y.o. female with hx of headaches,PCOS, and asthma here today with above complaint. This is a new problem.  She is on Depo Provera, she is due for next injection in 3 days. Mildly,constant heavy bleeding. She has not noted other source of bleeding.  She has been on Depo Provera since 08/2019. She has not noted vaginal discharge, mild pruritus.  Lab Results  Component Value Date   TSH 0.78 04/01/2020   Lab Results  Component Value Date   WBC 9.9 04/01/2020   HGB 14.2 04/01/2020   HCT 42.2 04/01/2020   MCV 82.7 04/01/2020   PLT 473 (H) 04/01/2020   She is not sexually active. Hx of STD's: Genital herpes.  Mild lower cramp pain, not radiated. Pain is intermittent. Not identified exacerbating or alleviating factors.  Negative for fever,chills,changes in appetite,changes in bowel habits,N/V,vaginal discharge, or urinary symptoms. She has not taken OTC medication.  Problem is stable.  Review of Systems  Constitutional: Negative for chills and fatigue.  HENT: Negative for mouth sores and nosebleeds.   Respiratory: Negative for shortness of breath.   Cardiovascular: Negative for chest pain and palpitations.  Endocrine: Negative for cold intolerance and heat intolerance.  Genitourinary: Negative for genital sores.  Musculoskeletal: Negative for gait problem and myalgias.  Skin: Negative for pallor and rash.  Neurological: Negative for syncope and weakness.  Rest see pertinent positives and negatives per HPI.  Current Outpatient Medications on File Prior to Visit  Medication Sig Dispense Refill  . albuterol (PROVENTIL) (5 MG/ML) 0.5% nebulizer solution Take 0.5 mLs (2.5 mg total) by nebulization every 6 (six) hours as needed for wheezing or shortness of breath. 20 mL 2  . albuterol (VENTOLIN HFA) 108  (90 Base) MCG/ACT inhaler Inhale 2 puffs into the lungs every 4 (four) hours as needed for wheezing or shortness of breath. 18 g 1  . medroxyPROGESTERone (DEPO-PROVERA) 150 MG/ML injection Inject 150 mg into the muscle every 3 (three) months.    . Nebulizer MISC 1 nebulizer machine for home use 1 each 0  . sodium fluoride (FLUORISHIELD) 1.1 % GEL dental gel PreviDent 5000 Booster Plus 1.1 % dental paste    . valACYclovir (VALTREX) 500 MG tablet 1 tab every 12 hours for 3 days prn for flare ups,asap. 30 tablet 0   No current facility-administered medications on file prior to visit.   Past Medical History:  Diagnosis Date  . Anemia   . Anxiety   . Asthma   . Depression   . OCD (obsessive compulsive disorder)   . PCOS (polycystic ovarian syndrome)    Allergies  Allergen Reactions  . Corylus Anaphylaxis     Hazelnuts   Hazelnuts     Social History   Socioeconomic History  . Marital status: Single    Spouse name: Not on file  . Number of children: Not on file  . Years of education: Not on file  . Highest education level: Not on file  Occupational History  . Not on file  Tobacco Use  . Smoking status: Never Smoker  . Smokeless tobacco: Never Used  Substance and Sexual Activity  . Alcohol use: No    Alcohol/week: 0.0 standard drinks  . Drug use: No  . Sexual activity: Never    Birth control/protection:  None  Other Topics Concern  . Not on file  Social History Narrative  . Not on file   Social Determinants of Health   Financial Resource Strain: Not on file  Food Insecurity: Not on file  Transportation Needs: Not on file  Physical Activity: Not on file  Stress: Not on file  Social Connections: Not on file    Vitals:   08/05/20 1556  BP: 118/70  Pulse: 92  Resp: 12  Temp: 97.7 F (36.5 C)  SpO2: 99%   Body mass index is 38.5 kg/m.  Physical Exam Vitals and nursing note reviewed. Exam conducted with a chaperone present.  Constitutional:      Appearance:  She is well-developed and well-groomed.  HENT:     Head: Normocephalic and atraumatic.  Eyes:     Conjunctiva/sclera: Conjunctivae normal.  Cardiovascular:     Rate and Rhythm: Normal rate and regular rhythm.     Heart sounds: No murmur heard.   Pulmonary:     Effort: Pulmonary effort is normal. No respiratory distress.     Breath sounds: Normal breath sounds.  Abdominal:     Palpations: There is no mass.     Tenderness: There is abdominal tenderness in the suprapubic area and left lower quadrant. There is no guarding or rebound.  Genitourinary:    Exam position: Lithotomy position.     Labia:        Right: No tenderness, lesion or injury.        Left: No tenderness, lesion or injury.      Vagina: Bleeding present. No vaginal discharge, erythema or tenderness.     Cervix: Cervical bleeding present. No cervical motion tenderness, discharge, friability, lesion or erythema.     Uterus: Not enlarged and not tender.      Adnexa:        Right: No mass, tenderness or fullness.         Left: No mass, tenderness or fullness.    Lymphadenopathy:     Lower Body: No right inguinal adenopathy. No left inguinal adenopathy.  Skin:    General: Skin is warm.     Findings: No erythema or rash.  Neurological:     Mental Status: She is alert and oriented to person, place, and time.     Gait: Gait normal.   ASSESSMENT AND PLAN:  Lester was seen today for vaginal bleeding.  Diagnoses and all orders for this visit: Orders Placed This Encounter  Procedures  . POCT urine pregnancy    Pelvic pain in female Examination today and hx do not suggest an infectious process. Pregnancy test negative. For now we will hold on imaging. Instructed about warning signs. Ibuprofen will help.  -     ibuprofen (ADVIL) 600 MG tablet; Take 1 tablet (600 mg total) by mouth every 8 (eight) hours as needed for up to 4 days.  DUB (dysfunctional uterine bleeding) We discussed possible etiologies. ?  Hormonal. For now we will hold on further work up. Pregnancy test negative. We discussed some side effects or Depo Provera, will consider adding or changing to OCP's.  -     ibuprofen (ADVIL) 600 MG tablet; Take 1 tablet (600 mg total) by mouth every 8 (eight) hours as needed for up to 4 days.   Return if symptoms worsen or fail to improve.   Betty G. [place], MD  [facility]. [place] office.    A few things to remember from today's visit:   Vaginal  bleeding - Plan: POCT urine pregnancy, Cervicovaginal ancillary only( [place])  DUB (dysfunctional uterine bleeding)  ? Hormonal. Pregnancy was ruled out today. Monitor for new symptoms. If recurrent ,we need to consider changing birth control and to arrange ultrasound.  Abnormal Uterine Bleeding Abnormal uterine bleeding means bleeding more than usual from your womb (uterus). It can include:  Bleeding between menstrual periods.  Bleeding after sex.  Bleeding that is heavier than normal.  Menstrual periods that last longer than usual.  Bleeding after you have stopped having your menstrual period (menopause). There are many problems that may cause this. You should see a doctor for any kind of bleeding that is not normal. Treatment depends on the cause of the bleeding. Follow these instructions at home: Medicines  Take over-the-counter and prescription medicines only as told by your doctor.  Tell your doctor about other medicines that you take. ? If told by your doctor, stop taking aspirin or medicines that have aspirin in them. These medicines can make you bleed more.  You may be given iron pills to replace iron that your body loses because of this condition. Take them as told by your doctor. Managing constipation If you are taking iron pills, you may have trouble pooping (constipation). To prevent or treat trouble pooping, you may need to:  Drink enough fluid to keep your pee (urine) pale  yellow.  Take over-the-counter or prescription medicines.  Eat foods that are high in fiber. These include beans, whole grains, and fresh fruits and vegetables.  Limit foods that are high in fat and sugar. These include fried or sweet foods. General instructions  Watch your condition for any changes.  Do not use tampons, douche, or have sex, if your doctor tells you not to.  Change your pads often.  Get regular exams. This includes pelvic exams and cervical cancer screenings. ? It is up to you to get the results of any tests that are done. Ask your doctor, or the department that is doing the tests, when your results will be ready.  Keep all follow-up visits as told by your doctor. This is important. Contact a doctor if:  The bleeding lasts more than 1 week.  You feel dizzy at times.  You feel like you may vomit (nausea).  You vomit.  You feel light-headed or weak.  Your symptoms get worse. Get help right away if:  You pass out.  You have to change pads every hour.  You have pain in your belly.  You have a fever or chills.  You get sweaty.  You get weak.  You pass large blood clots from your vagina. Summary  Abnormal uterine bleeding means bleeding more than usual from your womb (uterus).  Any kind of bleeding that is not normal should be checked by a doctor.  Treatment depends on the cause of the bleeding.  Get help right away if you pass out, you have to change pads every hour, or you pass large blood clots from your vagina. This information is not intended to replace advice given to you by your health care provider. Make sure you discuss any questions you have with your health care provider. Document Revised: 03/20/2019 Document Reviewed: 03/20/2019 Elsevier Patient Education  2021 Elsevier Inc.  Please be sure medication list is accurate. If a new problem present, please set up appointment sooner than planned today.

## 2020-08-07 LAB — CERVICOVAGINAL ANCILLARY ONLY
Chlamydia: NEGATIVE
Comment: NEGATIVE
Comment: NEGATIVE
Comment: NORMAL
Neisseria Gonorrhea: NEGATIVE
Trichomonas: NEGATIVE

## 2020-08-08 ENCOUNTER — Ambulatory Visit (INDEPENDENT_AMBULATORY_CARE_PROVIDER_SITE_OTHER): Payer: BC Managed Care – PPO

## 2020-08-08 ENCOUNTER — Other Ambulatory Visit: Payer: Self-pay

## 2020-08-08 DIAGNOSIS — Z3042 Encounter for surveillance of injectable contraceptive: Secondary | ICD-10-CM | POA: Diagnosis not present

## 2020-08-08 MED ORDER — MEDROXYPROGESTERONE ACETATE 150 MG/ML IM SUSP
150.0000 mg | Freq: Once | INTRAMUSCULAR | Status: AC
  Administered 2020-08-08: 150 mg via INTRAMUSCULAR

## 2020-08-08 NOTE — Progress Notes (Signed)
Pt came in for her Depo injection. Pt is on time, Injection given in the left upper outer quadrant; pt tolerated well. Next dates given to pt.

## 2020-08-16 ENCOUNTER — Emergency Department (HOSPITAL_COMMUNITY): Payer: BC Managed Care – PPO

## 2020-08-16 ENCOUNTER — Emergency Department (HOSPITAL_COMMUNITY)
Admission: EM | Admit: 2020-08-16 | Discharge: 2020-08-16 | Disposition: A | Discharge status: Home / Self Care | Payer: BC Managed Care – PPO | Attending: Emergency Medicine | Admitting: Emergency Medicine

## 2020-08-16 ENCOUNTER — Other Ambulatory Visit: Payer: Self-pay

## 2020-08-16 ENCOUNTER — Encounter (HOSPITAL_COMMUNITY): Payer: Self-pay

## 2020-08-16 DIAGNOSIS — J45909 Unspecified asthma, uncomplicated: Secondary | ICD-10-CM | POA: Diagnosis not present

## 2020-08-16 DIAGNOSIS — Y92410 Unspecified street and highway as the place of occurrence of the external cause: Secondary | ICD-10-CM | POA: Diagnosis not present

## 2020-08-16 DIAGNOSIS — M79632 Pain in left forearm: Secondary | ICD-10-CM | POA: Diagnosis not present

## 2020-08-16 DIAGNOSIS — S59919A Unspecified injury of unspecified forearm, initial encounter: Secondary | ICD-10-CM | POA: Diagnosis not present

## 2020-08-16 DIAGNOSIS — R000 Tachycardia, unspecified: Secondary | ICD-10-CM | POA: Insufficient documentation

## 2020-08-16 DIAGNOSIS — M79603 Pain in arm, unspecified: Secondary | ICD-10-CM | POA: Diagnosis not present

## 2020-08-16 DIAGNOSIS — S0990XA Unspecified injury of head, initial encounter: Secondary | ICD-10-CM | POA: Diagnosis not present

## 2020-08-16 DIAGNOSIS — M79602 Pain in left arm: Secondary | ICD-10-CM

## 2020-08-16 DIAGNOSIS — S0083XA Contusion of other part of head, initial encounter: Secondary | ICD-10-CM | POA: Insufficient documentation

## 2020-08-16 DIAGNOSIS — G4489 Other headache syndrome: Secondary | ICD-10-CM | POA: Diagnosis not present

## 2020-08-16 DIAGNOSIS — M79642 Pain in left hand: Secondary | ICD-10-CM | POA: Diagnosis not present

## 2020-08-16 DIAGNOSIS — S59912A Unspecified injury of left forearm, initial encounter: Secondary | ICD-10-CM | POA: Diagnosis not present

## 2020-08-16 DIAGNOSIS — S40022A Contusion of left upper arm, initial encounter: Secondary | ICD-10-CM | POA: Diagnosis not present

## 2020-08-16 MED ORDER — ACETAMINOPHEN 500 MG PO TABS
1000.0000 mg | ORAL_TABLET | Freq: Once | ORAL | Status: AC
  Administered 2020-08-16: 1000 mg via ORAL
  Filled 2020-08-16: qty 2
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
## 2020-08-16 NOTE — Progress Notes (Signed)
Orthopedic Tech Progress Note Patient Details:  Catherine Lane [birth year] [id number]  Ortho Devices Type of Ortho Device: Velcro wrist splint Ortho Device/Splint Location: left Ortho Device/Splint Interventions: Application   Post Interventions Patient Tolerated: Well Instructions Provided: Care of device   Saul Fordyce 08/16/2020, 6:27 PM
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
## 2020-08-18 ENCOUNTER — Other Ambulatory Visit: Payer: Self-pay

## 2020-08-19 ENCOUNTER — Ambulatory Visit: Payer: BC Managed Care – PPO | Admitting: Family Medicine

## 2020-08-19 ENCOUNTER — Encounter: Payer: Self-pay | Admitting: Family Medicine

## 2020-08-19 VITALS — BP 120/70 | HR 76 | Temp 98.6°F | Ht 62.0 in | Wt 207.2 lb

## 2020-08-19 DIAGNOSIS — S0093XA Contusion of unspecified part of head, initial encounter: Secondary | ICD-10-CM

## 2020-08-19 DIAGNOSIS — M79641 Pain in right hand: Secondary | ICD-10-CM
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
## 2020-08-25 DIAGNOSIS — F411 Generalized anxiety disorder: Secondary | ICD-10-CM | POA: Diagnosis not present

## 2020-08-25 DIAGNOSIS — F331 Major depressive disorder, recurrent, moderate: Secondary | ICD-10-CM | POA: Diagnosis not present

## 2020-10-02 ENCOUNTER — Ambulatory Visit (HOSPITAL_COMMUNITY)
Admission: EM | Admit: 2020-10-02 | Discharge: 2020-10-02 | Disposition: A | Discharge status: Home / Self Care | Payer: BC Managed Care – PPO | Attending: Internal Medicine | Admitting: Internal Medicine

## 2020-10-02 ENCOUNTER — Other Ambulatory Visit: Payer: Self-pay

## 2020-10-02 ENCOUNTER — Encounter (HOSPITAL_COMMUNITY): Payer: Self-pay

## 2020-10-02 DIAGNOSIS — G43009 Migraine without aura, not intractable, without status migrainosus: Secondary | ICD-10-CM

## 2020-10-02 MED ORDER — METOCLOPRAMIDE HCL 5 MG/ML IJ SOLN
INTRAMUSCULAR | Status: AC
  Filled 2020-10-02: qty 2

## 2020-10-02 MED ORDER — SUMATRIPTAN SUCCINATE 6 MG/0.5ML ~~LOC~~ SOLN
6.0000 mg | Freq: Once | SUBCUTANEOUS | Status: AC
  Administered 2020-10-02: 6 mg via SUBCUTANEOUS

## 2020-10-02 MED ORDER — KETOROLAC TROMETHAMINE 30 MG/ML IJ SOLN
30.0000 mg | Freq: Once | INTRAMUSCULAR | Status: AC
  Administered 2020-10-02: 30 mg via INTRAMUSCULAR

## 2020-10-02 MED ORDER — KETOROLAC TROMETHAMINE 30 MG/ML IJ SOLN
INTRAMUSCULAR | Status: AC
  Filled 2020-10-02: qty 1

## 2020-10-02 MED ORDER — SUMATRIPTAN SUCCINATE 6 MG/0.5ML ~~LOC~~ SOLN
SUBCUTANEOUS | Status: AC
  Filled 2020-10-02: qty 0.5

## 2020-10-02 MED ORDER — METOCLOPRAMIDE HCL 5 MG/ML IJ SOLN
5.0000 mg | Freq: Once | INTRAMUSCULAR | Status: AC
  Administered 2020-10-02: 5 mg via INTRAMUSCULAR

## 2020-10-02 MED ORDER — SUMATRIPTAN SUCCINATE 25 MG PO TABS: 25.0000 mg | ORAL_TABLET | Freq: Once | ORAL | 0 refills | Status: DC

## 2020-10-02 NOTE — ED Triage Notes (Signed)
Pt c/o headache and nausea x 1 week.

## 2020-10-02 NOTE — Discharge Instructions (Addendum)
Please take medications as prescribed If you have persistent headaches or worsening headaches please return to the urgent care or emergency department to be evaluated further.

## 2020-10-03 DIAGNOSIS — F411 Generalized anxiety disorder: Secondary | ICD-10-CM | POA: Diagnosis not present

## 2020-10-03 DIAGNOSIS — F331 Major depressive disorder, recurrent, moderate: Secondary | ICD-10-CM | POA: Diagnosis not present

## 2020-10-03 DIAGNOSIS — F4310 Post-traumatic stress disorder, unspecified: Secondary | ICD-10-CM | POA: Diagnosis not present

## 2020-10-06 NOTE — ED Provider Notes (Signed)
[facility]    CSN: [id number] Arrival date & time: 10/02/20  1842      History   Chief Complaint Chief Complaint  Patient presents with  . Headache  . Nausea    HPI Catherine Lane is a 25 y.o. female comes to urgent care complaining of headache and nausea for a week duration.  Symptoms started insidiously and has been persistent since onset.  Patient has tried Excedrin Migraine with short-lived relief.  Over the past 2 days headache has been persistent.  She describes the headache as throbbing, aggravated by bright lights and associated with nausea.  She has vomited once.  No fever, chills, neck pain or neck stiffness.  Patient has a history of migraines.   HPI  Past Medical History:  Diagnosis Date  . Anemia   . Anxiety   . Asthma   . Depression   . OCD (obsessive compulsive disorder)   . PCOS (polycystic ovarian syndrome)     Patient Active Problem List   Diagnosis Date Noted  . Recurrent genital herpes 09/25/2019  . PCOS (polycystic ovarian syndrome) 09/25/2019  . Class 2 obesity with body mass index (BMI) of 38.0 to 38.9 in adult 09/25/2019  . Headache 02/10/2015  . Asthma, mild intermittent 07/30/2014  . Irregular periods 07/30/2014    History reviewed. No pertinent surgical history.  OB History    Gravida  0   Para  0   Term  0   Preterm  0   AB  0   Living  0     SAB  0   IAB  0   Ectopic  0   Multiple  0   Live Births               Home Medications    Prior to Admission medications   Medication Sig Start Date End Date Taking? Authorizing Provider  SUMAtriptan (IMITREX) 25 MG tablet Take 1 tablet (25 mg total) by mouth once for 1 dose. May repeat in 2 hours if headache persists or recurs. 10/02/20 10/02/20 Yes Karmello Abercrombie, Britta Mccreedy, MD  albuterol (PROVENTIL) (5 MG/ML) 0.5% nebulizer solution Take 0.5 mLs (2.5 mg total) by nebulization every 6 (six) hours as needed for wheezing or shortness of breath. 09/22/19   Garlon Hatchet, PA-C  albuterol (VENTOLIN HFA) 108 (90 Base) MCG/ACT inhaler Inhale 2 puffs into the lungs every 4 (four) hours as needed for wheezing or shortness of breath. 09/22/19   Garlon Hatchet, PA-C  medroxyPROGESTERone (DEPO-PROVERA) 150 MG/ML injection Inject 150 mg into the muscle every 3 (three) months.    [provider]  Nebulizer MISC 1 nebulizer machine for home use 09/22/19   Garlon Hatchet, PA-C  sodium fluoride (FLUORISHIELD) 1.1 % GEL dental gel PreviDent 5000 Booster Plus 1.1 % dental paste    [provider]  valACYclovir (VALTREX) 500 MG tablet 1 tab every 12 hours for 3 days prn for flare ups,asap. 07/04/20   [place], Betty G, MD    Family History Family History  Problem Relation Age of Onset  . Asthma Father   . Hyperlipidemia Father   . Hypertension Father   . Diabetes Father   . Hyperlipidemia Paternal Aunt   . Cancer Paternal Uncle   . Diabetes Paternal Uncle   . Hyperlipidemia Paternal Grandmother     Social History Social History   Tobacco Use  . Smoking status: Never Smoker  . Smokeless tobacco: Never Used  Substance Use Topics  . Alcohol use: No    Alcohol/week: 0.0 standard drinks  . Drug use: No     Allergies   Corylus   Review of Systems Review of Systems  Constitutional: Negative.   HENT: Negative.   Gastrointestinal: Positive for nausea and vomiting. Negative for abdominal pain.  Neurological: Positive for headaches. Negative for dizziness and light-headedness.     Physical Exam Triage Vital Signs ED Triage Vitals  Enc Vitals Group     BP 10/02/20 1951 128/76     Pulse Rate 10/02/20 1951 89     Resp 10/02/20 1951 18     Temp 10/02/20 1951 98.6 F (37 C)     Temp Source 10/02/20 1951 Oral     SpO2 10/02/20 1951 100 %     Weight --      Height --      Head Circumference --      Peak Flow --      Pain Score 10/02/20 1949 4     Pain Loc --      Pain Edu? --      Excl. in GC? --    No data found.  Updated  Vital Signs BP 128/76 (BP Location: Right Arm)   Pulse 89   Temp 98.6 F (37 C) (Oral)   Resp 18   LMP  (LMP Unknown)   SpO2 100%   Visual Acuity Right Eye Distance:   Left Eye Distance:   Bilateral Distance:    Right Eye Near:   Left Eye Near:    Bilateral Near:     Physical Exam Vitals and nursing note reviewed.  Constitutional:      General: She is in acute distress.     Appearance: She is not ill-appearing.  Cardiovascular:     Heart sounds: Normal heart sounds.  Pulmonary:     Effort: Pulmonary effort is normal.     Breath sounds: Normal breath sounds.  Abdominal:     General: Bowel sounds are normal.     Palpations: Abdomen is soft.  Musculoskeletal:     Cervical back: Neck supple.  Neurological:     Mental Status: She is alert.     GCS: GCS eye subscore is 4. GCS verbal subscore is 5. GCS motor subscore is 6.     Cranial Nerves: No cranial nerve deficit, dysarthria or facial asymmetry.      UC Treatments / Results  Labs (all labs ordered are listed, but only abnormal results are displayed) Labs Reviewed - No data to display  EKG   Radiology No results found.  Procedures Procedures (including critical care time)  Medications Ordered in UC Medications  ketorolac (TORADOL) 30 MG/ML injection 30 mg (30 mg Intramuscular Given 10/02/20 2051)  metoCLOPramide (REGLAN) injection 5 mg (5 mg Intramuscular Given 10/02/20 2052)  SUMAtriptan (IMITREX) injection 6 mg (6 mg Subcutaneous Given 10/02/20 2051)    Initial Impression / Assessment and Plan / UC Course  I have reviewed the triage vital signs and the nursing notes.  Pertinent labs & imaging results that were available during my care of the patient were reviewed by me and considered in my medical decision making (see chart for details).     1.  Migraine without aura: Toradol 30 mg IM x1 dose Reglan 5 mg IM x1 Imitrex 6 mg subcu x1 dose Patient to be discharged home on Imitrex 25 mg at the outset of  headaches Return precautions given. Final Clinical Impressions(s) /  UC Diagnoses   Final diagnoses:  Migraine without aura and without status migrainosus, not intractable     Discharge Instructions     Please take medications as prescribed If you have persistent headaches or worsening headaches please return to the urgent care or emergency department to be evaluated further.   ED Prescriptions    Medication Sig Dispense Auth. Provider   SUMAtriptan (IMITREX) 25 MG tablet Take 1 tablet (25 mg total) by mouth once for 1 dose. May repeat in 2 hours if headache persists or recurs. 15 tablet Dmiyah Liscano, Britta Mccreedy, MD     PDMP not reviewed this encounter.   Merrilee Jansky, MD 10/06/20 [phone]

## 2020-10-30 DIAGNOSIS — Z20822 Contact with and (suspected) exposure to covid-19: Secondary | ICD-10-CM | POA: Diagnosis not present

## 2020-10-30 DIAGNOSIS — J00 Acute nasopharyngitis [common cold]: Secondary | ICD-10-CM | POA: Diagnosis not present

## 2020-10-30 DIAGNOSIS — Z03818 Encounter for observation for suspected exposure to other biological agents ruled out: Secondary | ICD-10-CM | POA: Diagnosis not present

## 2020-11-28 ENCOUNTER — Telehealth: Payer: Self-pay | Admitting: Family Medicine

## 2020-11-28 DIAGNOSIS — F411 Generalized anxiety disorder: Secondary | ICD-10-CM | POA: Diagnosis not present

## 2020-11-28 DIAGNOSIS — F331 Major depressive disorder, recurrent, moderate: Secondary | ICD-10-CM | POA: Diagnosis not present

## 2020-11-28 MED ORDER — ALBUTEROL SULFATE HFA 108 (90 BASE) MCG/ACT IN AERS: 2.0000 | INHALATION_SPRAY | RESPIRATORY_TRACT | 1 refills | Status: DC | PRN

## 2020-11-28 NOTE — Telephone Encounter (Signed)
Pt call and stated she need a refill for her albuterol (VENTOLIN HFA) 108 (90 Base) MCG/ACT inhaler sent to  [id number] - [place], [place] - [address] AT [facility] OF [address] Phone:  [phone]  Fax:  [phone]

## 2020-11-28 NOTE — Telephone Encounter (Signed)
Refill sent.

## 2020-12-05 DIAGNOSIS — H6502 Acute serous otitis media, left ear: Secondary | ICD-10-CM | POA: Diagnosis not present

## 2021-01-15 DIAGNOSIS — Z20822 Contact with and (suspected) exposure to covid-19: Secondary | ICD-10-CM | POA: Diagnosis not present

## 2021-01-15 DIAGNOSIS — J029 Acute pharyngitis, unspecified: Secondary | ICD-10-CM | POA: Diagnosis not present

## 2021-01-15 DIAGNOSIS — Z03818 Encounter for observation for suspected exposure to other biological agents ruled out: Secondary | ICD-10-CM | POA: Diagnosis not present

## 2021-01-23 DIAGNOSIS — F4320 Adjustment disorder, unspecified: Secondary | ICD-10-CM | POA: Diagnosis not present

## 2021-01-23 DIAGNOSIS — F331 Major depressive disorder, recurrent, moderate: Secondary | ICD-10-CM | POA: Diagnosis not present

## 2021-01-23 DIAGNOSIS — F411 Generalized anxiety disorder: Secondary | ICD-10-CM | POA: Diagnosis not present

## 2021-02-19 NOTE — Progress Notes (Deleted)
Date:  02/19/2021   HPI: Catherine Lane is a 25 y.o. female here for her 3 month PrEP follow up visit.  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   NEBULIZER MISC    1 nebulizer machine for home use   SUMATRIPTAN (IMITREX) 25 MG TABLET    Take 1 tablet (25 mg total) by mouth once for 1 dose. May repeat in 2 hours if headache persists or recurs.  Previous Medications   MEDROXYPROGESTERONE (DEPO-PROVERA) 150 MG/ML INJECTION    Inject 150 mg into the muscle every 3 (three) months.   SODIUM FLUORIDE (FLUORISHIELD) 1.1 % GEL DENTAL GEL    PreviDent 5000 Booster Plus 1.1 % dental paste  Modified Medications   Modified Medication Previous Medication   ALBUTEROL (VENTOLIN HFA) 108 (90 BASE) MCG/ACT INHALER albuterol (VENTOLIN HFA) 108 (90 Base) MCG/ACT inhaler      Inhale 2 puffs into the lungs every 4 (four) hours as needed for wheezing or shortness of breath.    Inhale 2 puffs into the lungs every 4 (four) hours as needed for wheezing or shortness of breath.   VALACYCLOVIR (VALTREX) 500 MG TABLET valACYclovir (VALTREX) 500 MG tablet      1 tab every 12 hours for 3 days prn for flare ups,asap.    1 tab every 12 hours for 3 days prn for flare ups,asap.  Discontinued Medications   ALBUTEROL (PROVENTIL HFA;VENTOLIN HFA) 108 (90 BASE) MCG/ACT  INHALER    Inhale 2 puffs into the lungs every 4 (four) hours as needed for wheezing or shortness of breath.   AMOXICILLIN (AMOXIL) 875 MG TABLET    Take 1 tablet (875 mg total) by mouth 2 (two) times daily.   EMTRICITABINE-TENOFOVIR (TRUVADA) 200-300 MG TABLET    Take 1 tablet by mouth daily.   IBUPROFEN (ADVIL,MOTRIN) 600 MG TABLET    Take 1 tablet (600 mg total) by mouth every 8 (eight) hours as needed (Take with food.).   NORELGESTROMIN-ETHINYL ESTRADIOL (ORTHO EVRA) 150-35 MCG/24HR TRANSDERMAL PATCH    Place 1 patch onto the skin once a week.   VALACYCLOVIR (VALTREX) 500 MG TABLET    Take 500 mg by mouth daily.    Allergies:   Past Medical History: Past Medical History:  Diagnosis Date   Anemia    Anxiety    Asthma    Depression    OCD (obsessive compulsive disorder)    PCOS (polycystic ovarian syndrome)     Social History: Social History   Socioeconomic History   Marital status: Single    Spouse name: Not on file   Number of children: Not on file   Years of education: Not on file   Highest education level: Not on file  Occupational History   Not on file  Tobacco Use   Smoking status: Never   Smokeless tobacco: Never  Substance and Sexual Activity   Alcohol use: No    Alcohol/week: 0.0 standard drinks   Drug use: No   Sexual activity: Never    Birth control/protection: None  Other Topics Concern   Not on file  Social History Narrative   Not on file   Social Determinants of Health   Financial Resource Strain: Not on file  Food Insecurity: Not on file  Transportation Needs: Not on file  Physical Activity: Not on file  Stress: Not on file  Social Connections: Not on file    [facility] HIV PREP FLOWSHEET RESULTS 05/02/2018 05/02/2018 01/31/2018 01/31/2018 11/02/2017 10/06/2017  Insurance Status Uninsured Uninsured Insured Uninsured Insured Insured  How did you hear? - referred by gyno - a referral from my doctor Friend, boyfriend  Friend, boyfriend is infect  Gender at  birth Female Female Female Female Female Female  Gender identity cis-Female cis-Female cis-Female cis-Female cis-Female cis-Female  Risk for HIV In sexual relationship with HIV+ partner;Condomless vaginal or anal intercourse In sexual relationship with HIV+ partner;Condomless vaginal or anal intercourse;Hx of STI In sexual relationship with HIV+ partner;Condomless vaginal or anal intercourse In sexual relationship with HIV+ partner;Condomless vaginal or anal intercourse In sexual relationship with HIV+ partner;Condomless vaginal or anal intercourse In sexual relationship with HIV+ partner;Condomless vaginal or anal intercourse;Hx of STI  Sex Partners Men only Men only Men only Women only Men only Men only  # sex partners past 3-6 mos 1-3 1-3 1-3 1-3 1-3 1-3  Sex activity preferences Receptive;Oral Receptive;Oral Receptive;Oral Receptive;Oral Receptive;Oral Receptive;Oral  Condom use No No No No No Yes  % condom use - - - - 0 85  Partners genders and ages - M 20-24 - M 20-24 M 20-24 M 20-24  Treated for STI? No No No No No Yes  HIV symptoms? Sore throat Flu-like/Mono-like symptoms N/A N/A - N/A  PrEP Eligibility HIV negative;CrCl >60 ml/min;Substantial risk for HIV HIV negative;Past treatment for potential exposure to HIV from sex or injectable drugs HIV negative;Substantial risk for HIV;CrCl >60 ml/min HIV negative;Past treatment for potential exposure to HIV from sex or injectable drugs Substantial risk for HIV;CrCl >60 ml/min;HIV negative HIV negative;Substantial risk for HIV  Preg status No No No No No No  Breastfeeding? No No No No N/A N/A  Paper work received? - No - No - No    Labs:  SCr: Lab Results  Component Value Date   CREATININE 0.82 04/01/2020   CREATININE 0.73 05/02/2018   CREATININE 0.75 11/02/2017   CREATININE 0.76 10/06/2017   CREATININE 0.75 06/21/2016   HIV Lab Results  Component Value Date   HIV NON-REACTIVE 09/25/2019   HIV NON-REACTIVE 05/02/2018   HIV  NON-REACTIVE 01/31/2018   HIV NON-REACTIVE 11/02/2017   HIV NON-REACTIVE 10/06/2017   Hepatitis B Lab Results  Component Value Date   HEPBSAB REACTIVE (A) 10/06/2017   HEPBSAG NON-REACTIVE 10/06/2017   Hepatitis C Lab Results  Component Value Date   HEPCAB NON-REACTIVE 10/06/2017   Hepatitis A Lab Results  Component Value Date   HAV REACTIVE (A) 10/06/2017   RPR and STI Lab Results  Component Value Date   LABRPR NON-REACTIVE 09/25/2019   LABRPR NON-REACTIVE 10/06/2017    STI Results GC CT  08/05/2020 Negative Negative  09/25/2019 Negative Negative  11/28/2018 Negative Negative  10/06/2017 Negative Negative  10/06/2017 Negative Negative  10/06/2017 Negative Negative    Assessment: Catherine Lane is here for her 3  month PrEP follow up visit. She reports that she has not taken Truvada for ~2 weeks because she ran out of refills. However, the last prescription was sent for #30 with 2 refills on 11/03/17. She has continued to have sex with her HIV+ partner during this time without condoms. Her partner is on ART and she confirms that he takes his medication every day. She denies any s/sx of acute HIV infection.  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 She has now been hired on at [employer] and is expecting to have new insurance coverage soon. In the meantime, she has been using [place] patient assistance. She reports making $15.75/hr and works 30-40 hours per week. Income verification letter written and signed during this visit.  Plan: Pregnancy test HIV Ab 3 months Truvada if negative Next 3 month f/u on 05/02/18 @ 0915  Erin N. Zigmund Daniel, PharmD PGY2 Infectious Diseases Pharmacy Resident Phone: [phone] 02/19/2021, 12:36 PM

## 2021-04-17 DIAGNOSIS — F331 Major depressive disorder, recurrent, moderate: Secondary | ICD-10-CM | POA: Diagnosis not present

## 2021-04-17 DIAGNOSIS — F411 Generalized anxiety disorder: Secondary | ICD-10-CM | POA: Diagnosis not present

## 2021-04-17 DIAGNOSIS — F902 Attention-deficit hyperactivity disorder, combined type: Secondary | ICD-10-CM | POA: Diagnosis not present

## 2021-04-18 ENCOUNTER — Other Ambulatory Visit: Payer: Self-pay | Admitting: Family Medicine

## 2021-05-15 DIAGNOSIS — F331 Major depressive disorder, recurrent, moderate: Secondary | ICD-10-CM | POA: Diagnosis not present

## 2021-05-15 DIAGNOSIS — F902 Attention-deficit hyperactivity disorder, combined type: Secondary | ICD-10-CM | POA: Diagnosis not present

## 2021-05-15 DIAGNOSIS — F411 Generalized anxiety disorder: Secondary | ICD-10-CM | POA: Diagnosis not present

## 2021-06-10 DIAGNOSIS — Z6841 Body Mass Index (BMI) 40.0 and over, adult: Secondary | ICD-10-CM | POA: Diagnosis not present

## 2021-06-10 DIAGNOSIS — N912 Amenorrhea, unspecified: Secondary | ICD-10-CM | POA: Diagnosis not present

## 2021-06-10 DIAGNOSIS — Z113 Encounter for screening for infections with a predominantly sexual mode of transmission: Secondary | ICD-10-CM | POA: Diagnosis not present

## 2021-06-10 DIAGNOSIS — Z01419 Encounter for gynecological examination (general) (routine) without abnormal findings: Secondary | ICD-10-CM | POA: Diagnosis not present

## 2021-06-17 DIAGNOSIS — F429 Obsessive-compulsive disorder, unspecified: Secondary | ICD-10-CM | POA: Diagnosis not present

## 2021-06-17 DIAGNOSIS — F331 Major depressive disorder, recurrent, moderate: Secondary | ICD-10-CM | POA: Diagnosis not present

## 2021-06-17 DIAGNOSIS — F902 Attention-deficit hyperactivity disorder, combined type: Secondary | ICD-10-CM | POA: Diagnosis not present

## 2021-06-17 DIAGNOSIS — F411 Generalized anxiety disorder: Secondary | ICD-10-CM | POA: Diagnosis not present

## 2021-06-28 ENCOUNTER — Other Ambulatory Visit: Payer: Self-pay | Admitting: Family Medicine

## 2021-07-15 ENCOUNTER — Telehealth: Payer: Self-pay | Admitting: Family Medicine

## 2021-07-15 ENCOUNTER — Encounter: Payer: Self-pay | Admitting: Family Medicine

## 2021-07-15 ENCOUNTER — Telehealth (INDEPENDENT_AMBULATORY_CARE_PROVIDER_SITE_OTHER): Payer: BC Managed Care – PPO | Admitting: Family Medicine

## 2021-07-15 VITALS — Ht 62.0 in

## 2021-07-15 DIAGNOSIS — R051 Acute cough: Secondary | ICD-10-CM | POA: Diagnosis not present

## 2021-07-15 DIAGNOSIS — J069 Acute upper respiratory infection, unspecified: Secondary | ICD-10-CM

## 2021-07-15 DIAGNOSIS — J4521 Mild intermittent asthma with (acute) exacerbation: Secondary | ICD-10-CM | POA: Diagnosis not present

## 2021-07-15 MED ORDER — PREDNISONE 20 MG PO TABS: 40.0000 mg | ORAL_TABLET | Freq: Every day | ORAL | 0 refills | Status: AC

## 2021-07-15 MED ORDER — BENZONATATE 100 MG PO CAPS: 100.0000 mg | ORAL_CAPSULE | Freq: Two times a day (BID) | ORAL | 0 refills | Status: AC | PRN

## 2021-07-15 MED ORDER — FLUTICASONE PROPIONATE 50 MCG/ACT NA SUSP: 1.0000 | Freq: Two times a day (BID) | NASAL | 0 refills | Status: DC

## 2021-07-15 NOTE — Progress Notes (Signed)
Virtual Visit via Video Note I connected with Analys L Kathleene Hazel on 07/15/21 by a video enabled telemedicine application and verified that I am speaking with the correct person using two identifiers.  Location patient: home Location provider:work office Persons participating in the virtual visit: patient, provider  I discussed the limitations of evaluation and management by telemedicine and the availability of in person appointments. The patient expressed understanding and agreed to proceed.  Chief Complaint  Patient presents with   Cough   Headache   Fatigue   HPI: Olisa is a 26 yo female with hx of asthma,anxiety,and migraine headache with above complaints. 4-5 days of fatigue, subjective fever, chills, nasal congestion, rhinorrhea, sore throat, productive cough, wheezing, shortness of breath, and body aches. Asthma on albuterol inhaler, which she is using daily.  Negative for anosmia,ageusia,CP, palpitation, abdominal pain, nausea, vomiting, changes in bowel habits, urinary symptoms, or a skin rash. Her brother was sick last week, he is feeling better now.  She performed a home COVID-19 test yesterday and it was negative. She has been taking Tylenol, DayQuil, and TheraFlu. Symptoms are stable.  ROS: See pertinent positives and negatives per HPI.  Past Medical History:  Diagnosis Date   Anemia    Anxiety    Asthma    Depression    OCD (obsessive compulsive disorder)    PCOS (polycystic ovarian syndrome)    History reviewed. No pertinent surgical history.  Family History  Problem Relation Age of Onset   Asthma Father    Hyperlipidemia Father    Hypertension Father    Diabetes Father    Hyperlipidemia Paternal Aunt    Cancer Paternal Uncle    Diabetes Paternal Uncle    Hyperlipidemia Paternal Grandmother     Social History   Socioeconomic History   Marital status: Single    Spouse name: Not on file   Number of children: Not on file   Years of education: Not  on file   Highest education level: Not on file  Occupational History   Not on file  Tobacco Use   Smoking status: Never   Smokeless tobacco: Never  Substance and Sexual Activity   Alcohol use: No    Alcohol/week: 0.0 standard drinks   Drug use: No   Sexual activity: Never    Birth control/protection: None  Other Topics Concern   Not on file  Social History Narrative   Not on file   Social Determinants of Health   Financial Resource Strain: Not on file  Food Insecurity: Not on file  Transportation Needs: Not on file  Physical Activity: Not on file  Stress: Not on file  Social Connections: Not on file  Intimate Partner Violence: Not on file      Current Outpatient Medications:    albuterol (PROVENTIL) (5 MG/ML) 0.5% nebulizer solution, Take 0.5 mLs (2.5 mg total) by nebulization every 6 (six) hours as needed for wheezing or shortness of breath., Disp: 20 mL, Rfl: 2   benzonatate (TESSALON) 100 MG capsule, Take 1-2 capsules (100-200 mg total) by mouth 2 (two) times daily as needed for up to 10 days., Disp: 30 capsule, Rfl: 0   fluticasone (FLONASE) 50 MCG/ACT nasal spray, Place 1 spray into both nostrils 2 (two) times daily., Disp: 16 g, Rfl: 0   medroxyPROGESTERone (DEPO-PROVERA) 150 MG/ML injection, Inject 150 mg into the muscle every 3 (three) months., Disp: , Rfl:    Nebulizer MISC, 1 nebulizer machine for home use, Disp: 1 each, Rfl: 0  predniSONE (DELTASONE) 20 MG tablet, Take 2 tablets (40 mg total) by mouth daily with breakfast for 5 days., Disp: 10 tablet, Rfl: 0   sodium fluoride (FLUORISHIELD) 1.1 % GEL dental gel, PreviDent 5000 Booster Plus 1.1 % dental paste, Disp: , Rfl:    valACYclovir (VALTREX) 500 MG tablet, 1 tab every 12 hours for 3 days prn for flare ups,asap., Disp: 30 tablet, Rfl: 0   VENTOLIN HFA 108 (90 Base) MCG/ACT inhaler, INHALE 2 PUFFS BY MOUTH EVERY 4 HOURS AS NEEDED FOR WHEEZING OR SHORTNESS OF BREATH, Disp: 18 g, Rfl: 1   SUMAtriptan (IMITREX)  25 MG tablet, Take 1 tablet (25 mg total) by mouth once for 1 dose. May repeat in 2 hours if headache persists or recurs., Disp: 15 tablet, Rfl: 0  EXAM:  VITALS per patient if applicable:Ht 5\' 2"  (1.575 m)    BMI 37.91 kg/m   GENERAL: alert, oriented, appears well and in no acute distress  HEENT: atraumatic, conjunctiva clear, no obvious abnormalities on inspection of external nose and ears Nasal congestion and rhinorrhea.  NECK: normal movements of the head and neck  LUNGS: on inspection no signs of respiratory distress, breathing rate appears normal, no obvious gross SOB, gasping or wheezing  CV: no obvious cyanosis  MS: moves all visible extremities without noticeable abnormality  PSYCH/NEURO: pleasant and cooperative, no obvious depression or anxiety, speech and thought processing grossly intact  ASSESSMENT AND PLAN:  Discussed the following assessment and plan:  Mild intermittent asthma with acute exacerbation - Plan: predniSONE (DELTASONE) 20 MG tablet, DG Chest 2 View Prednisone 20 mg 2 tablets daily with breakfast for 3 to 5 days. Albuterol inh 2 puff every 6 hours for a week then as needed for wheezing or shortness of breath.  On inspection she does not seem to be in acute respiratory distress, she was instructed to go to the ED if symptoms get worse. I do not think imaging is needed at this time, future order placed for her to have it done if not any better by Friday.  URI, acute - Plan: fluticasone (FLONASE) 50 MCG/ACT nasal spray Symptoms suggests a viral etiology, symptomatic treatment recommended, so I do not think abx is needed at this time. Instructed to monitor for signs of complications. Recommend monitoring temperature. Flonase daily as needed for nasal congestion as well as nasal saline irrigations. Continue Tylenol 500 mg 3-4 times per day as needed. Instructed to repeat COVID-19 test today and to let Wednesday know if positive. F/U as needed.  Acute cough -  Plan: benzonatate (TESSALON) 100 MG capsule I also explained that cough and nasal congestion can last a few days and sometimes weeks. Benzonatate for symptomatic treatment. Plain Mucinex may also help.  We discussed possible serious and likely etiologies, options for evaluation and workup, limitations of telemedicine visit vs in person visit, treatment, treatment risks and precautions. The patient was advised to call back or seek an in-person evaluation if the symptoms worsen or if the condition fails to improve as anticipated. I discussed the assessment and treatment plan with the patient. The patient was provided an opportunity to ask questions and all were answered. The patient agreed with the plan and demonstrated an understanding of the instructions.  Return if symptoms worsen or fail to improve.  Newt Levingston G. [place], MD  [facility]. [place] office.

## 2021-07-15 NOTE — Telephone Encounter (Signed)
Pt had mychart appt today and she is calling back to let dr Martinique know her home covid test is negative. Please advise

## 2021-07-15 NOTE — Telephone Encounter (Signed)
Patient calling in with respiratory symptoms: Shortness of breath, chest pain, palpitations or other red words send to Triage  Does the patient have a fever over 100, cough, congestion, sore throat, runny nose, lost of taste/smell (please list symptoms that patient has)?cough, congestion, fatigue and headache  What date did symptoms start?07-10-2021 (If over 5 days ago, pt may be scheduled for in person visit)  Have you tested for Covid in the last 5 days? Yes   If yes, was it positive []  OR negative [x] ? If positive in the last 5 days, please schedule virtual visit now. If negative, schedule for an in person OV with the next available provider if PCP has no openings. Please also let patient know they will be tested again (follow the script below)  "you will have to arrive prior to your appt time to be Covid tested. Please [facility] in back of [facility] & call [phone] to let the staff know you have arrived. A staff member will meet you at your car to do a rapid covid test. Once the test has resulted you will be notified by phone of your results to determine if appt will remain an in person visit or be converted to a virtual/phone visit. If you arrive less than before your appt time, your visit will be automatically converted to virtual & any recommended testing will happen AFTER the visit." Pt has virtual with dr [phone] 07-15-2021 4 pm  THINGS TO REMEMBER  If no availability for virtual visit in office,  please schedule another [place] office  If no availability at another [place] office, please instruct patient that they can schedule an evisit or virtual visit through their mychart account. Visits up to 8pm  patients can be seen in office 5 days after positive COVID test

## 2021-07-17 ENCOUNTER — Telehealth: Payer: Self-pay | Admitting: Family Medicine

## 2021-07-17 ENCOUNTER — Ambulatory Visit (INDEPENDENT_AMBULATORY_CARE_PROVIDER_SITE_OTHER)
Admission: RE | Admit: 2021-07-17 | Discharge: 2021-07-17 | Disposition: A | Discharge status: Home / Self Care | Payer: BC Managed Care – PPO | Source: Ambulatory Visit | Attending: Family Medicine | Admitting: Family Medicine

## 2021-07-17 ENCOUNTER — Other Ambulatory Visit: Payer: Self-pay

## 2021-07-17 ENCOUNTER — Encounter: Payer: Self-pay | Admitting: Family Medicine

## 2021-07-17 DIAGNOSIS — R062 Wheezing: Secondary | ICD-10-CM | POA: Diagnosis not present

## 2021-07-17 DIAGNOSIS — J4520 Mild intermittent asthma, uncomplicated: Secondary | ICD-10-CM | POA: Diagnosis not present

## 2021-07-17 DIAGNOSIS — J4521 Mild intermittent asthma with (acute) exacerbation: Secondary | ICD-10-CM | POA: Diagnosis not present

## 2021-07-17 DIAGNOSIS — R059 Cough, unspecified: Secondary | ICD-10-CM | POA: Diagnosis not present

## 2021-07-17 DIAGNOSIS — J45901 Unspecified asthma with (acute) exacerbation: Secondary | ICD-10-CM | POA: Diagnosis not present

## 2021-07-17 NOTE — Telephone Encounter (Signed)
Pt seen dr [place] on 07-15-2021 and still waiting on work note from 07-15-2021 -07-17-2021 and return to work on 07-20-2021. Please put on mychart

## 2021-07-17 NOTE — Telephone Encounter (Signed)
Letter sent through mychart.

## 2021-08-12 DIAGNOSIS — F331 Major depressive disorder, recurrent, moderate: Secondary | ICD-10-CM | POA: Diagnosis not present

## 2021-08-12 DIAGNOSIS — F429 Obsessive-compulsive disorder, unspecified: Secondary | ICD-10-CM | POA: Diagnosis not present

## 2021-08-12 DIAGNOSIS — F411 Generalized anxiety disorder: Secondary | ICD-10-CM | POA: Diagnosis not present

## 2021-09-09 NOTE — Progress Notes (Signed)
? ?Chief Complaint  ?Patient presents with  ? Medication Management  ?  Discuss weight loss medication - psychiatrist recommended Ozempic, but wanted to get pcp's opinion before starting it.  ? ?HPI: ?Catherine Lane is a 26 y.o. female  with hx of PCOS,asthma, and obesity here today to discuss weight loss options. ?She follows with psychiatrist regularly and recommended Ozempic, she would like to know about medication. ?She has tried to lose wt for about a year but instead she keeps gaining wt. ?For the past 3-4 months she has been more consistent with exercising regularly, 3 times per week. She is does 15-20 min of cardio and 40 min of strength training exercise. ?Since she started exercising more regular she is feeling better, more energetic. ?Her asthma has also been better, she is not using Albuterol inh as frequent as she did. ? ?Trying to follow a healthful diet. States that she decreased sugar intake, eating smaller portions, stopped sodas and candy, decreased fried foods. Eating 3 meals and snacks x 2. ? ?Today she did not have breakfast, for lunch she got chicken snuggest from [employer] with water. ?Yesterday for supper she had yogurt, granola,and a protein shake. ?Breakfast yesterday a mini chicken biscuit with water and skipped breakfast. ?She snacks on pap corn,crackers,pretzels,or cereal bars. ?She does not like fruits, she likes vegetables. ? ?Review of Systems  ?Constitutional:  Negative for activity change, appetite change, fatigue and fever.  ?HENT:  Negative for mouth sores, nosebleeds and trouble swallowing.   ?Respiratory:  Negative for cough, shortness of breath and wheezing.   ?Cardiovascular:  Negative for chest pain, palpitations and leg swelling.  ?Gastrointestinal:  Negative for abdominal pain, nausea and vomiting.  ?     Negative for changes in bowel habits.  ?Endocrine: Negative for cold intolerance, heat intolerance, polydipsia, polyphagia and polyuria.  ?Musculoskeletal:   Negative for arthralgias, gait problem and myalgias.  ?Skin:  Negative for rash.  ?Allergic/Immunologic: Positive for environmental allergies.  ?Neurological:  Negative for syncope and weakness.  ?Rest see pertinent positives and negatives per HPI. ? ?Current Outpatient Medications on File Prior to Visit  ?Medication Sig Dispense Refill  ? albuterol (PROVENTIL) (5 MG/ML) 0.5% nebulizer solution Take 0.5 mLs (2.5 mg total) by nebulization every 6 (six) hours as needed for wheezing or shortness of breath. 20 mL 2  ? Nebulizer MISC 1 nebulizer machine for home use 1 each 0  ? sodium fluoride (FLUORISHIELD) 1.1 % GEL dental gel PreviDent 5000 Booster Plus 1.1 % dental paste    ? valACYclovir (VALTREX) 500 MG tablet 1 tab every 12 hours for 3 days prn for flare ups,asap. 30 tablet 0  ? VENTOLIN HFA 108 (90 Base) MCG/ACT inhaler INHALE 2 PUFFS BY MOUTH EVERY 4 HOURS AS NEEDED FOR WHEEZING OR SHORTNESS OF BREATH 18 g 1  ? ?No current facility-administered medications on file prior to visit.  ? ? ?Past Medical History:  ?Diagnosis Date  ? Anemia   ? Anxiety   ? Asthma   ? Depression   ? OCD (obsessive compulsive disorder)   ? PCOS (polycystic ovarian syndrome)   ? ?Allergies  ?Allergen Reactions  ? Corylus Anaphylaxis  ?   Hazelnuts  ?  ?  ? ? ?Social History  ? ?Socioeconomic History  ? Marital status: Single  ?  Spouse name: Not on file  ? Number of children: Not on file  ? Years of education: Not on file  ? Highest education level: Some college, no  degree  ?Occupational History  ? Not on file  ?Tobacco Use  ? Smoking status: Never  ? Smokeless tobacco: Never  ?Substance and Sexual Activity  ? Alcohol use: No  ?  Alcohol/week: 0.0 standard drinks  ? Drug use: No  ? Sexual activity: Never  ?  Birth control/protection: None  ?Other Topics Concern  ? Not on file  ?Social History Narrative  ? Not on file  ? ?Social Determinants of Health  ? ?Financial Resource Strain: Medium Risk  ? Difficulty of Paying Living Expenses:  Somewhat hard  ?Food Insecurity: Food Insecurity Present  ? Worried About Programme researcher, broadcasting/film/video in the Last Year: Never true  ? Ran Out of Food in the Last Year: Sometimes true  ?Transportation Needs: No Transportation Needs  ? Lack of Transportation (Medical): No  ? Lack of Transportation (Non-Medical): No  ?Physical Activity: Sufficiently Active  ? Days of Exercise per Week: 3 days  ? Minutes of Exercise per Session: 60 min  ?Stress: No Stress Concern Present  ? Feeling of Stress : Only a little  ?Social Connections: Moderately Isolated  ? Frequency of Communication with Friends and Family: More than three times a week  ? Frequency of Social Gatherings with Friends and Family: Once a week  ? Attends Religious Services: 1 to 4 times per year  ? Active Member of Clubs or Organizations: No  ? Attends Banker Meetings: Not on file  ? Marital Status: Never married  ? ?Vitals:  ? 09/11/21 1449  ?BP: 126/70  ?Pulse: 96  ?Resp: 16  ?SpO2: 99%  ? ?Wt Readings from Last 3 Encounters:  ?09/11/21 242 lb 8 oz (110 kg)  ?08/19/20 207 lb 4 oz (94 kg)  ?08/05/20 210 lb 8 oz (95.5 kg)  ? ?Body mass index is 44.35 kg/m?. ? ?Physical Exam ?Vitals and nursing note reviewed.  ?Constitutional:   ?   General: She is not in acute distress. ?   Appearance: She is well-developed.  ?HENT:  ?   Head: Normocephalic and atraumatic.  ?Eyes:  ?   Conjunctiva/sclera: Conjunctivae normal.  ?Cardiovascular:  ?   Rate and Rhythm: Normal rate and regular rhythm.  ?   Heart sounds: No murmur heard. ?Pulmonary:  ?   Effort: Pulmonary effort is normal. No respiratory distress.  ?   Breath sounds: Normal breath sounds.  ?Abdominal:  ?   Palpations: Abdomen is soft. There is no mass.  ?   Tenderness: There is no abdominal tenderness.  ?Skin: ?   General: Skin is warm.  ?   Findings: No erythema or rash.  ?Neurological:  ?   General: No focal deficit present.  ?   Mental Status: She is alert and oriented to person, place, and time.  ?   Cranial  Nerves: No cranial nerve deficit.  ?   Gait: Gait normal.  ?Psychiatric:  ?   Comments: Well groomed, good eye contact.  ? ?ASSESSMENT AND PLAN: ? ?Angela was seen today for medication management. ? ?Diagnoses and all orders for this visit: ?Orders Placed This Encounter  ?Procedures  ? Amb Ref to Medical Weight Management  ? ?Morbid obesity (HCC) ?Since 07/2020 she has gained about 32 Lb. ?We reviewed her dietary habits in the past 48 hours and it seems like there is some room for improvement.  ?Healthier snacks optiShe does not like any fruit. She likes vegetables, so cucumbers and carrots are good options for snack, both she likes. 2 eggs +  one piece of toast (< 100 cal) for breakfast. ?Cooking at home: Chicken meat,or fish 4 oz with vegetables. ? ?We discussed benefits of wt loss. ?Consistency with regular physical activity encouraged. ?I do not recommend pharmacologic treatment at this time, some side effects of Ozempic discussed. ?She agrees with referral to wt loss clinic. ? ?Mild intermittent asthma without complication ?Improved. ?Continue Albuterol inh 2 puff qid prn. ?Wt loss will definitively help. ? ?I spent a total of 35 minutes in both face to face and non face to face activities for this visit on the date of this encounter. During this time history was obtained and documented, examination was performed, and assessment/plan discussed. ? ?Return if symptoms worsen or fail to improve. ? ?Damante Spragg G. [place], MD ? ?[facility]. ?[place] office. ? ?

## 2021-09-11 ENCOUNTER — Encounter: Payer: Self-pay | Admitting: Family Medicine

## 2021-09-11 ENCOUNTER — Ambulatory Visit: Payer: BC Managed Care – PPO | Admitting: Family Medicine

## 2021-09-11 DIAGNOSIS — J4520 Mild intermittent asthma, uncomplicated: Secondary | ICD-10-CM

## 2021-09-11 NOTE — Patient Instructions (Signed)
A few things to remember from today's visit: ? ?Class 2 severe obesity with serious comorbidity and body mass index (BMI) of 38.0 to 38.9 in adult, unspecified obesity type (HCC) - Plan: Amb Ref to Medical Weight Management ? ?Mild intermittent asthma without complication ? ?If you need refills please call your pharmacy. ?Do not use My Chart to request refills or for acute issues that need immediate attention. ?  ?Please be sure medication list is accurate. ?If a new problem present, please set up appointment sooner than planned today. ? ? ?Mediterranean Diet ?A Mediterranean diet refers to food and lifestyle choices that are based on the traditions of countries located on the [employer]. It focuses on eating more fruits, vegetables, whole grains, beans, nuts, seeds, and heart-healthy fats, and eating less dairy, meat, eggs, and processed foods with added sugar, salt, and fat. This way of eating has been shown to help prevent certain conditions and improve outcomes for people who have chronic diseases, like kidney disease and heart disease. ?What are tips for following this plan? ?Reading food labels ?Check the serving size of packaged foods. For foods such as rice and pasta, the serving size refers to the amount of cooked product, not dry. ?Check the total fat in packaged foods. Avoid foods that have saturated fat or trans fats. ?Check the ingredient list for added sugars, such as corn syrup. ?Shopping ? ?Buy a variety of foods that offer a balanced diet, including: ?Fresh fruits and vegetables (produce). ?Grains, beans, nuts, and seeds. Some of these may be available in unpackaged forms or large amounts (in bulk). ?Fresh seafood. ?Poultry and eggs. ?Low-fat dairy products. ?Buy whole ingredients instead of prepackaged foods. ?Buy fresh fruits and vegetables in-season from local farmers markets. ?Buy plain frozen fruits and vegetables. ?If you do not have access to quality fresh seafood, buy precooked frozen  shrimp or canned fish, such as tuna, salmon, or sardines. ?Stock your pantry so you always have certain foods on hand, such as olive oil, canned tuna, canned tomatoes, rice, pasta, and beans. ?Cooking ?Cook foods with extra-virgin olive oil instead of using butter or other vegetable oils. ?Have meat as a side dish, and have vegetables or grains as your main dish. This means having meat in small portions or adding small amounts of meat to foods like pasta or stew. ?Use beans or vegetables instead of meat in common dishes like chili or lasagna. ?Experiment with different cooking methods. Try roasting, broiling, steaming, and saut?ing vegetables. ?Add frozen vegetables to soups, stews, pasta, or rice. ?Add nuts or seeds for added healthy fats and plant protein at each meal. You can add these to yogurt, salads, or vegetable dishes. ?Marinate fish or vegetables using olive oil, lemon juice, garlic, and fresh herbs. ?Meal planning ?Plan to eat one vegetarian meal one day each week. Try to work up to two vegetarian meals, if possible. ?Eat seafood two or more times a week. ?Have healthy snacks readily available, such as: ?Vegetable sticks with hummus. ?[place] yogurt. ?Fruit and nut trail mix. ?Eat balanced meals throughout the week. This includes: ?Fruit: 2-3 servings a day. ?Vegetables: 4-5 servings a day. ?Low-fat dairy: 2 servings a day. ?Fish, poultry, or lean meat: 1 serving a day. ?Beans and legumes: 2 or more servings a week. ?Nuts and seeds: 1-2 servings a day. ?Whole grains: 6-8 servings a day. ?Extra-virgin olive oil: 3-4 servings a day. ?Limit red meat and sweets to only a few servings a month. ?Lifestyle ? ?Cook and  eat meals together with your family, when possible. ?Drink enough fluid to keep your urine pale yellow. ?Be physically active every day. This includes: ?Aerobic exercise like running or swimming. ?Leisure activities like gardening, walking, or housework. ?Get 7-8 hours of sleep each night. ?If  recommended by your health care provider, drink red wine in moderation. This means 1 glass a day for nonpregnant women and 2 glasses a day for men. A glass of wine equals 5 oz (150 mL). ?What foods should I eat? ?Fruits ?Apples. Apricots. Avocado. Berries. Bananas. Cherries. Dates. Figs. Grapes. Lemons. Melon. Oranges. Peaches. Plums. Pomegranate. ?Vegetables ?Artichokes. Beets. Broccoli. Cabbage. Carrots. Eggplant. Green beans. Chard. Kale. Spinach. Onions. Leeks. Peas. Squash. Tomatoes. Peppers. Radishes. ?Grains ?Whole-grain pasta. Brown rice. Bulgur wheat. Polenta. Couscous. Whole-wheat bread. Orpah Cobb. ?Meats and other proteins ?Beans. Almonds. Sunflower seeds. Pine nuts. Peanuts. Cod. Salmon. Scallops. Shrimp. Tuna. Tilapia. Clams. Oysters. Eggs. Poultry without skin. ?Dairy ?Low-fat milk. Cheese. Greek yogurt. ?Fats and oils ?Extra-virgin olive oil. Avocado oil. Grapeseed oil. ?Beverages ?Water. Red wine. Herbal tea. ?Sweets and desserts ?Greek yogurt with honey. Baked apples. Poached pears. Trail mix. ?Seasonings and condiments ?Basil. Cilantro. Coriander. Cumin. Mint. Parsley. Sage. Rosemary. Tarragon. Garlic. Oregano. Thyme. Pepper. Balsamic vinegar. Tahini. Hummus. Tomato sauce. Olives. Mushrooms. ?The items listed above may not be a complete list of foods and beverages you can eat. Contact a dietitian for more information. ?What foods should I limit? ?This is a list of foods that should be eaten rarely or only on special occasions. ?Fruits ?Fruit canned in syrup. ?Vegetables ?Deep-fried potatoes (french fries). ?Grains ?Prepackaged pasta or rice dishes. Prepackaged cereal with added sugar. Prepackaged snacks with added sugar. ?Meats and other proteins ?Beef. Pork. Lamb. Poultry with skin. Hot dogs. Tomasa Blase. ?Dairy ?Ice cream. Sour cream. Whole milk. ?Fats and oils ?Butter. Canola oil. Vegetable oil. Beef fat (tallow). Lard. ?Beverages ?Juice. Sugar-sweetened soft drinks. Beer. Liquor and  spirits. ?Sweets and desserts ?Cookies. Cakes. Pies. Candy. ?Seasonings and condiments ?Mayonnaise. Pre-made sauces and marinades. ?The items listed above may not be a complete list of foods and beverages you should limit. Contact a dietitian for more information. ?Summary ?The Mediterranean diet includes both food and lifestyle choices. ?Eat a variety of fresh fruits and vegetables, beans, nuts, seeds, and whole grains. ?Limit the amount of red meat and sweets that you eat. ?If recommended by your health care provider, drink red wine in moderation. This means 1 glass a day for nonpregnant women and 2 glasses a day for men. A glass of wine equals 5 oz (150 mL). ?This information is not intended to replace advice given to you by your health care provider. Make sure you discuss any questions you have with your health care provider. ?Document Revised: 06/22/2019 Document Reviewed: 04/19/2019 ?Elsevier Patient Education ? 2023 [employer] ? ? ? ? ? ?

## 2021-09-14 DIAGNOSIS — F429 Obsessive-compulsive disorder, unspecified: Secondary | ICD-10-CM | POA: Diagnosis not present

## 2021-09-14 DIAGNOSIS — F331 Major depressive disorder, recurrent, moderate: Secondary | ICD-10-CM | POA: Diagnosis not present

## 2021-09-14 DIAGNOSIS — F411 Generalized anxiety disorder: Secondary | ICD-10-CM | POA: Diagnosis not present

## 2021-09-14 DIAGNOSIS — F902 Attention-deficit hyperactivity disorder, combined type: Secondary | ICD-10-CM | POA: Diagnosis not present

## 2021-09-22 ENCOUNTER — Other Ambulatory Visit: Payer: Self-pay | Admitting: Family Medicine

## 2021-09-22 DIAGNOSIS — A6000 Herpesviral infection of urogenital system, unspecified: Secondary | ICD-10-CM

## 2021-09-23 DIAGNOSIS — Z0289 Encounter for other administrative examinations: Secondary | ICD-10-CM

## 2021-09-30 ENCOUNTER — Ambulatory Visit (INDEPENDENT_AMBULATORY_CARE_PROVIDER_SITE_OTHER): Payer: BC Managed Care – PPO | Admitting: Bariatrics

## 2021-09-30 ENCOUNTER — Encounter (INDEPENDENT_AMBULATORY_CARE_PROVIDER_SITE_OTHER): Payer: Self-pay | Admitting: Bariatrics

## 2021-09-30 VITALS — BP 120/84 | HR 88 | Temp 97.9°F | Ht 62.0 in | Wt 234.0 lb

## 2021-09-30 DIAGNOSIS — E559 Vitamin D deficiency, unspecified: Secondary | ICD-10-CM | POA: Diagnosis not present

## 2021-09-30 DIAGNOSIS — R7309 Other abnormal glucose: Secondary | ICD-10-CM

## 2021-09-30 DIAGNOSIS — Z1331 Encounter for screening for depression: Secondary | ICD-10-CM

## 2021-09-30 DIAGNOSIS — R5383 Other fatigue: Secondary | ICD-10-CM | POA: Diagnosis not present

## 2021-09-30 DIAGNOSIS — R0602 Shortness of breath: Secondary | ICD-10-CM | POA: Diagnosis not present

## 2021-09-30 DIAGNOSIS — Z6841 Body Mass Index (BMI) 40.0 and over, adult: Secondary | ICD-10-CM

## 2021-09-30 DIAGNOSIS — E282 Polycystic ovarian syndrome: Secondary | ICD-10-CM

## 2021-09-30 DIAGNOSIS — E668 Other obesity: Secondary | ICD-10-CM

## 2021-09-30 DIAGNOSIS — F5089 Other specified eating disorder: Secondary | ICD-10-CM

## 2021-09-30 DIAGNOSIS — K5909 Other constipation: Secondary | ICD-10-CM | POA: Diagnosis not present

## 2021-09-30 DIAGNOSIS — J45909 Unspecified asthma, uncomplicated: Secondary | ICD-10-CM | POA: Diagnosis not present

## 2021-10-02 LAB — COMPREHENSIVE METABOLIC PANEL
ALT: 29 IU/L (ref 0–32)
AST: 22 IU/L (ref 0–40)
Albumin/Globulin Ratio: 1.2 (ref 1.2–2.2)
Albumin: 4 g/dL (ref 3.9–5.0)
Alkaline Phosphatase: 65 IU/L (ref 44–121)
BUN/Creatinine Ratio: 9 (ref 9–23)
BUN: 8 mg/dL (ref 6–20)
Bilirubin Total: 0.4 mg/dL (ref 0.0–1.2)
CO2: 19 mmol/L — ABNORMAL LOW (ref 20–29)
Calcium: 9.2 mg/dL (ref 8.7–10.2)
Chloride: 101 mmol/L (ref 96–106)
Creatinine, Ser: 0.86 mg/dL (ref 0.57–1.00)
Globulin, Total: 3.4 g/dL (ref 1.5–4.5)
Glucose: 92 mg/dL (ref 70–99)
Potassium: 4.6 mmol/L (ref 3.5–5.2)
Sodium: 136 mmol/L (ref 134–144)
Total Protein: 7.4 g/dL (ref 6.0–8.5)
eGFR: 96 mL/min/{1.73_m2} (ref >59)

## 2021-10-02 LAB — HEMOGLOBIN A1C
Est. average glucose Bld gHb Est-mCnc: 108 mg/dL
Hgb A1c MFr Bld: 5.4 % (ref 4.8–5.6)

## 2021-10-02 LAB — LIPID PANEL WITH LDL/HDL RATIO
Cholesterol, Total: 182 mg/dL (ref 100–199)
HDL: 37 mg/dL — ABNORMAL LOW (ref >39)
LDL Chol Calc (NIH): 119 mg/dL — ABNORMAL HIGH (ref 0–99)
LDL/HDL Ratio: 3.2 ratio (ref 0.0–3.2)
Triglycerides: 142 mg/dL (ref 0–149)
VLDL Cholesterol Cal: 26 mg/dL (ref 5–40)

## 2021-10-02 LAB — INSULIN, RANDOM: INSULIN: 25.3 u[IU]/mL — ABNORMAL HIGH (ref 2.6–24.9)

## 2021-10-02 LAB — TSH+T4F+T3FREE
Free T4: 1.33 ng/dL (ref 0.82–1.77)
T3, Free: 4.7 pg/mL — ABNORMAL HIGH (ref 2.0–4.4)
TSH: 1.17 u[IU]/mL (ref 0.450–4.500)

## 2021-10-02 LAB — VITAMIN D 25 HYDROXY (VIT D DEFICIENCY, FRACTURES): Vit D, 25-Hydroxy: 16.7 ng/mL — ABNORMAL LOW (ref 30.0–100.0)

## 2021-10-03 NOTE — Progress Notes (Signed)
? ? ? ?Chief Complaint:  ? ?OBESITY ?Catherine Lane (MR# [id number]) is a 26 y.o. female who presents for evaluation and treatment of obesity and related comorbidities. Current BMI is Body mass index is 42.8 kg/m?Catherine Lane has been struggling with her weight for many years and has been unsuccessful in either losing weight, maintaining weight loss, or reaching her healthy weight goal. ? ?[name] states that she likes to cook but notes eating is an obstacle. She states that she craves salty crunchy foods. She states she skips meals.  ? ?[name] is currently in the action stage of change and ready to dedicate time achieving and maintaining a healthier weight. [name] is interested in becoming our patient and working on intensive lifestyle modifications including (but not limited to) diet and exercise for weight loss. ? ?[name]'s habits were reviewed today and are as follows: Her family eats meals together, she struggles with family and or coworkers weight loss sabotage, her desired weight loss is 74 pounds, she started gaining weight after graduating highschool , her heaviest weight ever was 234 pounds, she is a picky eater and doesn't like to eat healthier foods, she has significant food cravings issues, she wakes up frequently in the middle of the night to eat, she skips meals frequently, she is frequently drinking liquids with calories, she frequently makes poor food choices, she frequently eats larger portions than normal, and she struggles with emotional eating. ? ?Depression Screen ?Catherine Lane's Food and Mood (modified PHQ-9) score was 15. ? ? ?  09/30/2021  ?  7:17 AM  ?Depression screen PHQ 2/9  ?Decreased Interest 2  ?Down, Depressed, Hopeless 2  ?PHQ - 2 Score 4  ?Altered sleeping 1  ?Tired, decreased energy 2  ?Change in appetite 1  ?Feeling bad or failure about yourself  3  ?Trouble concentrating 3  ?Moving slowly or fidgety/restless 0  ?Suicidal thoughts 1  ?PHQ-9 Score 15  ?Difficult doing work/chores Not difficult at  all  ? ?Subjective:  ? ?1. Other fatigue ?Catherine Lane admits to daytime somnolence and admits to waking up still tired. Patient has a history of symptoms of daytime fatigue and morning fatigue. Catherine Lane generally gets 7 or 8 hours of sleep per night, and states that she has difficulty falling asleep and generally restful sleep. Snoring is present. Apneic episodes are not present. Epworth Sleepiness Score is 6. Catherine Lane states she is exhausted after work.   ? ?2. SOB (shortness of breath) on exertion ?Catherine Lane notes increasing shortness of breath with exercising and seems to be worsening over time with weight gain. She notes getting out of breath sooner with activity than she used to. This has not gotten worse recently. Catherine Lane denies shortness of breath at rest or orthopnea.  ? ?3. PCOS (polycystic ovarian syndrome) ?[name] was diagnosed at the age of 31 BCP. ? ?4. Asthma, unspecified asthma severity, unspecified whether complicated, unspecified whether persistent ?Catherine Lane use her inhaler 2 times monthly.  ? ?5. Other constipation ?Catherine Lane notes some diarrhea and has a family history of chronic constipation. ? ?6. Elevated glucose ?[name] has a family history of PCOS.  ? ?7. Vitamin D deficiency ?Catherine Lane is currently taking multivitamins 2 daily.  ? ?8. Other disorder of eating ?Catherine Lane notes stress and emotional eating.  ? ?Assessment/Plan:  ? ?1. Other fatigue ?Catherine Lane does feel that her weight is causing her energy to be lower than it should be. Fatigue may be related to obesity, depression or many other causes. Labs will be ordered, and in  the meanwhile, Catherine Lane will focus on self care including making healthy food choices, increasing physical activity and focusing on stress reduction. Catherine Lane will gradually increase exercise and activities. We will review EKG and we will check TSH today. ? ?- EKG 12-Lead ?- TSH+T4F+T3Free ? ?2. SOB (shortness of breath) on exertion ?Catherine Lane does feel that she gets out of breath more easily that she used to when she exercises.  Catherine Lane's shortness of breath appears to be obesity related and exercise induced. She has agreed to work on weight loss and gradually increase exercise to treat her exercise induced shortness of breath. Will continue to monitor closely.  ?- TSH+T4F+T3Free ? ?3. PCOS (polycystic ovarian syndrome) ?Intensive lifestyle modifications are first line treatment for this issue. [name] will follow up with her OB/GYN. We will check insulin and A1C today. We discussed several lifestyle modifications today and she will continue to work on diet, exercise and weight loss efforts. Orders and follow up as documented in patient record. ? ?Counseling ?PCOS is a leading cause of menstrual irregularities and infertility. It is also associated with obesity, hirsutism (excessive hair growth on the face, chest, or back), and cardiovascular risk factors such as high cholesterol and insulin resistance. ?Insulin resistance appears to play a central role.  ?Women with PCOS have been shown to have impaired appetite-regulating hormones. ?Metformin is one medication that can improve metabolic parameters.  ?Women with polycystic ovary syndrome (PCOS) have an increased risk for cardiovascular disease (CVD) - European Journal of Preventive Cardiology.  ? ?- Insulin, random ?- Hemoglobin A1c ?- Lipid Panel With LDL/HDL Ratio ?- Comprehensive metabolic panel ? ?4. Asthma, unspecified asthma severity, unspecified whether complicated, unspecified whether persistent ?Catherine Lane will continue using her albuterol inhaler as needed.  ? ?5. Other constipation ?Catherine Lane was informed that a decrease in bowel movement frequency is normal while losing weight, but stools should not be hard or painful. Catherine Lane will increase her water intake. She will eat raw vegetables and incorporate flax seeds in her diet. She will add Miralax as need and she  up as documented in patient record.  ? ?Counseling ?Getting to Good Bowel Health: Your goal is to have one soft bowel movement each day.  Drink at least 8 glasses of water each day. Eat plenty of fiber (goal is over 25 grams each day). It is best to get most of your fiber from dietary sources which includes leafy green vegetables, fresh fruit, and whole grains. You may need to add fiber with the help of OTC fiber supplements. These include Metamucil, Citrucel, and Flaxseed. If you are still having trouble, try adding Miralax or Magnesium Citrate. If all of these changes do not work, Dietitiancontact your physician.  ? ?6. Elevated glucose ?We will check A1C and insulin today.  ? ?- Insulin, random ?- Hemoglobin A1c ? ?7. Vitamin D deficiency ?Low Vitamin D level contributes to fatigue and are associated with obesity, breast, and colon cancer.We will check Vitamin D today and Chesni  follow-up for routine testing of Vitamin D, at least 2-3 times per year to avoid over-replacement. ? ?- VITAMIN D 25 Hydroxy (Vit-D Deficiency, Fractures) ? ?8. Other disorder of eating ?Behavior modification techniques were discussed today to help Alynn deal with her emotional/non-hunger eating behaviors.  Shanteria was referred to Dr. Dewaine CongerBarker our Bariatric Psychologist and follow up as documented in patient record.  ? ?9. Depression screening ?Veleria had a positive depression screening. Depression is commonly associated with obesity and often results in emotional eating behaviors. We  will monitor this closely and work on CBT to help improve the non-hunger eating patterns. Referral to Psychology may be required if no improvement is seen as she continues in our clinic.  ? ?10. Class 3 severe obesity due to excess calories without serious comorbidity with body mass index (BMI) of 40.0 to 44.9 in adult [facility]) ?Nakiea is currently in the action stage of change and her goal is to continue with weight loss efforts. I recommend Durinda begin the structured treatment plan as follows: ? ?She has agreed to the Category 4 Plan. ? ?Ludmila will continue meal planning. She will not skip meals. She will have lunch  daily. ? ?Exercise goals: No exercise has been prescribed at this time.  ? ?Behavioral modification strategies: increasing lean protein intake, decreasing simple carbohydrates, increasing vegetables,

## 2021-10-05 ENCOUNTER — Encounter (INDEPENDENT_AMBULATORY_CARE_PROVIDER_SITE_OTHER): Payer: Self-pay | Admitting: Bariatrics

## 2021-10-05 DIAGNOSIS — E786 Lipoprotein deficiency: Secondary | ICD-10-CM | POA: Insufficient documentation

## 2021-10-05 DIAGNOSIS — E8881 Metabolic syndrome: Secondary | ICD-10-CM | POA: Insufficient documentation

## 2021-10-13 NOTE — Progress Notes (Unsigned)
Office: [phone]  /  Fax: [phone]    Date: 10/27/2021   Appointment Start Time: *** Duration: *** minutes Provider: Lawerance Cruel, Psy.D. Type of Session: Intake for Individual Therapy  Location of Patient: {gbptloc:23249} (private location) Location of Provider: Provider's home (private office) Type of Contact: Telepsychological Visit via MyChart Video Visit  Informed Consent: This provider called Catherine Lane at 12:02pm as she did not present for today's appointment. *** As such, today's appointment was initiated *** minutes late.Prior to proceeding with today's appointment, two pieces of identifying information were obtained. In addition, Catherine Lane's physical location at the time of this appointment was obtained as well a phone number she could be reached at in the event of technical difficulties. Catherine Lane and this provider participated in today's telepsychological service.   The provider's role was explained to Catherine Lane. The provider reviewed and discussed issues of confidentiality, privacy, and limits therein (e.g., reporting obligations). In addition to verbal informed consent, written informed consent for psychological services was obtained prior to the initial appointment. Since the clinic is not a 24/7 crisis center, mental health emergency resources were shared and this  provider explained MyChart, e-mail, voicemail, and/or other messaging systems should be utilized only for non-emergency reasons. This provider also explained that information obtained during appointments will be placed in Catherine Lane's medical record and relevant information will be shared with other providers at [facility] for coordination of care. Catherine Lane agreed information may be shared with other [facility] providers as needed for coordination of care and by signing the service agreement document, she provided written consent for coordination of care. Prior to initiating telepsychological  services, Catherine Lane completed an informed consent document, which included the development of a safety plan (i.e., an emergency contact and emergency resources) in the event of an emergency/crisis. Catherine Lane verbally acknowledged understanding she is ultimately responsible for understanding her insurance benefits for telepsychological and in-person services. This provider also reviewed confidentiality, as it relates to telepsychological services, as well as the rationale for telepsychological services (i.e., to reduce exposure risk to COVID-19). Catherine Lane  acknowledged understanding that appointments cannot be recorded without both party consent and she is aware she is responsible for securing confidentiality on her end of the session. Catherine Lane verbally consented to proceed.  Chief Complaint/HPI: Catherine Lane was referred by Dr. Corinna Capra due to other disorder of eating. Per the note for the initial visit with Dr. Corinna Capra on 09/30/2021, "Catherine Lane notes stress and emotional eating." The note for the initial appointment further indicated the following: "Catherine Lane's habits were reviewed today and are as follows: Her family eats meals together, she struggles with family and or coworkers weight loss sabotage, her desired weight loss is 74 pounds, she started gaining weight after graduating highschool , her heaviest weight ever was 234 pounds, she is a picky eater and doesn't like to eat healthier foods, she has significant food cravings issues, she wakes up frequently in the middle of the night to eat, she skips meals frequently, she is frequently drinking liquids with calories, she frequently makes poor food choices, she frequently eats larger portions than normal, and she struggles with emotional eating." Catherine Lane's Food and Mood (modified PHQ-9) score on 09/30/2021 was 15.  During today's appointment, Catherine Lane was verbally administered a questionnaire assessing various behaviors related to emotional eating behaviors. Catherine Lane endorsed the following:  {gbmoodandfood:21755}. She shared she craves ***. Catherine Lane believes the onset of emotional eating behaviors was *** and described the current frequency of emotional eating  behaviors as ***. In addition, Catherine Lane {gblegal:22371} a history of binge eating behaviors. *** Currently, Catherine Lane indicated *** triggers emotional eating behaviors, whereas *** makes emotional eating behaviors better. Furthermore, Catherine Lane {gblegal:22371} other problems of concern. ***   Mental Status Examination:  Appearance: {Appearance:22431} Behavior: {Behavior:22445} Mood: {gbmood:21757} Affect: {Affect:22436} Speech: {Speech:22432} Eye Contact: {Eye Contact:22433} Psychomotor Activity: {Motor Activity:22434} Gait: unable to assess  Thought Process: {thought process:22448}  Thought Content/Perception: {disturbances:22451} Orientation: {Orientation:22437} Memory/Concentration: {gbcognition:22449} Insight/Judgment: {Insight:22446}  Family & Psychosocial History: Catherine Lane reported she is *** and ***. She indicated she is currently ***. Additionally, Catherine Lane shared her highest level of education obtained is ***. Currently, Catherine Lane's social support system consists of her ***. Moreover, Catherine Lane stated she resides with her ***.   Medical History: ***  Mental Health History: Catherine Lane reported ***. She {gblegal:22371} a history of psychotropic medications. Catherine Lane {Endorse or deny of item:23407} hospitalizations for psychiatric concerns. Catherine Lane {gblegal:22371} a family history of mental health related concerns. *** Catherine Lane {Endorse or deny of item:23407} trauma including {gbtrauma:22071} abuse, as well as neglect. ***  Catherine Lane described her typical mood lately as ***. Aside from concerns noted above and endorsed on the PHQ-9 and GAD-7, Catherine Lane reported ***. Catherine Lane {gblegal:22371} current alcohol use. *** She {gblegal:22371} tobacco use. *** She {gblegal:22371} illicit/recreational substance use. Regarding caffeine intake, Elfrida reported ***. Furthermore, Arthella indicated she  is not experiencing the following: {gbsxs:21965}. She also denied history of and current suicidal ideation, plan, and intent; history of and current homicidal ideation, plan, and intent; and history of and current engagement in self-harm. Notably, Catherine Lane endorsed item 9 (i.e., "Do you feel that your weight problem is so hopeless that sometimes life doesn't seem worth living?") on the modified PHQ-9 during her initial appointment with Dr. Corinna Capra on 09/30/2021. ***  The following strengths were reported by Sheleen: ***. The following strengths were observed by this provider: ability to express thoughts and feelings during the therapeutic session, ability to establish and benefit from a therapeutic relationship, willingness to work toward established goal(s) with the clinic and ability to engage in reciprocal conversation. ***  Legal History: Novali {Endorse or deny of item:23407} legal involvement.   Structured Assessments Results: The Patient Health Questionnaire-9 (PHQ-9) is a self-report measure that assesses symptoms and severity of depression over the course of the last two weeks. Xavia obtained a score of *** suggesting {GBPHQ9SEVERITY:21752}. Braylen finds the endorsed symptoms to be {gbphq9difficulty:21754}. [0= Not at all; 1= Several days; 2= More than half the days; 3= Nearly every day] Little interest or pleasure in doing things ***  Feeling down, depressed, or hopeless ***  Trouble falling or staying asleep, or sleeping too much ***  Feeling tired or having little energy ***  Poor appetite or overeating ***  Feeling bad about yourself --- or that you are a failure or have let yourself or your family down ***  Trouble concentrating on things, such as reading the newspaper or watching television ***  Moving or speaking so slowly that other people could have noticed? Or the opposite --- being so fidgety or restless that you have been moving around a lot more than usual ***  Thoughts that you would be  better off dead or hurting yourself in some way ***  PHQ-9 Score ***    The Generalized Anxiety Disorder-7 (GAD-7) is a brief self-report measure that assesses symptoms of anxiety over the course of the last two weeks. Saraiya obtained a score of *** suggesting {gbgad7severity:21753}. Navneet finds the endorsed symptoms to  be {gbphq9difficulty:21754}. [0= Not at all; 1= Several days; 2= Over half the days; 3= Nearly every day] Feeling nervous, anxious, on edge ***  Not being able to stop or control worrying ***  Worrying too much about different things ***  Trouble relaxing ***  Being so restless that it's hard to sit still ***  Becoming easily annoyed or irritable ***  Feeling afraid as if something awful might happen ***  GAD-7 Score ***   Interventions:  {Interventions List for Intake:23406}  Diagnostic Impressions & Provisional DSM-5 Diagnosis(es): Based on the aforementioned, the following diagnosis(es) were assigned: {Diagnoses:22752}.  Plan: Derricka appears able and willing to participate as evidenced by collaboration on a treatment goal, engagement in reciprocal conversation, and asking questions as needed for clarification. The next appointment is scheduled for *** at ***, which will be {gbtxmodality:23402}. The following treatment goal was established: {gbtxgoals:21759}. This provider will regularly review the treatment plan and medical chart to keep informed of status changes. Selam expressed understanding and agreement with the initial treatment plan of care. *** Nyeli will be sent a handout via e-mail to utilize between now and the next appointment to increase awareness of hunger patterns and subsequent eating. Saharah provided verbal consent during today's appointment for this provider to send the handout via e-mail. ***

## 2021-10-19 ENCOUNTER — Encounter (INDEPENDENT_AMBULATORY_CARE_PROVIDER_SITE_OTHER): Payer: Self-pay | Admitting: Family Medicine

## 2021-10-19 ENCOUNTER — Ambulatory Visit (INDEPENDENT_AMBULATORY_CARE_PROVIDER_SITE_OTHER): Payer: BC Managed Care – PPO | Admitting: Family Medicine

## 2021-10-19 VITALS — BP 118/81 | HR 90 | Temp 98.7°F | Ht 62.0 in | Wt 229.0 lb

## 2021-10-19 DIAGNOSIS — E785 Hyperlipidemia, unspecified: Secondary | ICD-10-CM | POA: Diagnosis not present

## 2021-10-19 DIAGNOSIS — E8881 Metabolic syndrome: Secondary | ICD-10-CM

## 2021-10-19 DIAGNOSIS — F908 Attention-deficit hyperactivity disorder, other type: Secondary | ICD-10-CM | POA: Diagnosis not present

## 2021-10-19 DIAGNOSIS — E559 Vitamin D deficiency, unspecified: Secondary | ICD-10-CM | POA: Diagnosis not present

## 2021-10-19 DIAGNOSIS — E669 Obesity, unspecified: Secondary | ICD-10-CM

## 2021-10-19 DIAGNOSIS — Z6841 Body Mass Index (BMI) 40.0 and over, adult: Secondary | ICD-10-CM

## 2021-10-19 MED ORDER — VITAMIN D (ERGOCALCIFEROL) 1.25 MG (50000 UNIT) PO CAPS: 50000.0000 [IU] | ORAL_CAPSULE | ORAL | 0 refills | Status: DC

## 2021-10-26 DIAGNOSIS — F988 Other specified behavioral and emotional disorders with onset usually occurring in childhood and adolescence: Secondary | ICD-10-CM | POA: Insufficient documentation

## 2021-10-26 DIAGNOSIS — E559 Vitamin D deficiency, unspecified: Secondary | ICD-10-CM | POA: Insufficient documentation

## 2021-10-26 DIAGNOSIS — E785 Hyperlipidemia, unspecified: Secondary | ICD-10-CM | POA: Insufficient documentation

## 2021-10-26 NOTE — Progress Notes (Signed)
Chief Complaint:   OBESITY Catherine Lane is here to discuss her progress with her obesity treatment plan along with follow-up of her obesity related diagnoses. Catherine Lane is on the Category 4 Plan and states she is following her eating plan approximately 80% of the time. Catherine Lane states she is going to the gym 60 minutes 2-3 times per week.  Today's visit was #: 2 Starting weight: 234 lbs Starting date: 09/30/2021 Today's weight: 229 lbs Today's date: 10/19/2021 Total lbs lost to date: 5 lbs Total lbs lost since last in-office visit: 5 lbs  Interim History: Catherine Lane feels meal plan was easy enough to follow.  She has been hungry, but has felt it is easier to manage.  She has some cravings now.  She has been eating string cheese or yogurt for snacks. She was able to get most food in.  She has no upcoming plans, trips, events.  No changes need to be made in the next few weeks.   Subjective:   1. Attention deficit hyperactivity disorder (ADHD), other type Catherine Lane sees Psych Daine Gip in [place] at [facility].  She feels she is well managed on Vyvanse.   2. Vitamin D deficiency Catherine Lane is currently not on any Vitamin D supplementations.  Complains of fatigue.  3. Insulin resistance Current A1c is 5.4, Insulin level 25.3.  Catherine Lane has some cravings for carbohydrates but it is manageable. She has a history of PCOS.   4. Dyslipidemia HDL 37, Triglycerides 142, LDL 119, Total 182.   Catherine Lane is currently not on any medications. Age excludes risk stratification.   Assessment/Plan:   1. Attention deficit hyperactivity disorder (ADHD), other type Catherine Lane has agreed to follow up with Dorna Mai for continued management.   2. Vitamin D deficiency Catherine Lane has agreed to start prescription Vitamin D, 50,000 IU weekly #4 no refills.  See below.   - Vitamin D, Ergocalciferol, (DRISDOL) 1.25 MG (50000 UNIT) CAPS capsule; Take 1 capsule (50,000 Units total) by mouth every 7 (seven) days.  Dispense: 4 capsule;  Refill: 0  3. Insulin resistance Pathophysiology discussed today of insulin resistance.   4. Dyslipidemia Repeat labs in 3 months.  5. Obesity, current BMI 42.0 Catherine Lane is currently in the action stage of change. As such, her goal is to continue with weight loss efforts. She has agreed to the Category 4 Plan.   Exercise goals:  As is.   Behavioral modification strategies: increasing lean protein intake, meal planning and cooking strategies, keeping healthy foods in the home, and planning for success.  Catherine Lane has agreed to follow-up with our clinic in 3 weeks. She was informed of the importance of frequent follow-up visits to maximize her success with intensive lifestyle modifications for her multiple health conditions.   Objective:   Blood pressure 118/81, pulse 90, temperature 98.7 F (37.1 C), height 5\' 2"  (1.575 m), weight 229 lb (103.9 kg), SpO2 98 %. Body mass index is 41.88 kg/m.  General: Cooperative, alert, well developed, in no acute distress. HEENT: Conjunctivae and lids unremarkable. Cardiovascular: Regular rhythm.  Lungs: Normal work of breathing. Neurologic: No focal deficits.   Lab Results  Component Value Date   CREATININE 0.86 09/30/2021   BUN 8 09/30/2021   NA 136 09/30/2021   K 4.6 09/30/2021   CL 101 09/30/2021   CO2 19 (L) 09/30/2021   Lab Results  Component Value Date   ALT 29 09/30/2021   AST 22 09/30/2021   ALKPHOS 65 09/30/2021   BILITOT 0.4  09/30/2021   Lab Results  Component Value Date   HGBA1C 5.4 09/30/2021   HGBA1C 5.0 09/25/2019   HGBA1C 4.9 06/21/2016   HGBA1C 5.5 04/30/2014   Lab Results  Component Value Date   INSULIN 25.3 (H) 09/30/2021   Lab Results  Component Value Date   TSH 1.170 09/30/2021   Lab Results  Component Value Date   CHOL 182 09/30/2021   HDL 37 (L) 09/30/2021   LDLCALC 119 (H) 09/30/2021   TRIG 142 09/30/2021   Lab Results  Component Value Date   VD25OH 16.7 (L) 09/30/2021   VD25OH 12 (L) 06/21/2016    Lab Results  Component Value Date   WBC 9.9 04/01/2020   HGB 14.2 04/01/2020   HCT 42.2 04/01/2020   MCV 82.7 04/01/2020   PLT 473 (H) 04/01/2020   No results found for: IRON, TIBC, FERRITIN  Attestation Statements:   Reviewed by clinician on day of visit: allergies, medications, problem list, medical history, surgical history, family history, social history, and previous encounter notes.  Time spent on visit including pre-visit chart review and post-visit care and charting was 45 minutes.   I, Davy Pique, RMA, am acting as transcriptionist for Coralie Common, MD.  I have reviewed the above documentation for accuracy and completeness, and I agree with the above. - Coralie Common, MD

## 2021-10-27 ENCOUNTER — Telehealth: Payer: Self-pay | Admitting: Family Medicine

## 2021-10-27 ENCOUNTER — Telehealth (INDEPENDENT_AMBULATORY_CARE_PROVIDER_SITE_OTHER): Payer: BC Managed Care – PPO | Admitting: Psychology

## 2021-10-27 ENCOUNTER — Telehealth (INDEPENDENT_AMBULATORY_CARE_PROVIDER_SITE_OTHER): Payer: Self-pay | Admitting: Psychology

## 2021-10-27 MED ORDER — VENTOLIN HFA 108 (90 BASE) MCG/ACT IN AERS: INHALATION_SPRAY | RESPIRATORY_TRACT | 1 refills | Status: DC

## 2021-10-27 NOTE — Telephone Encounter (Signed)
Pt need a refill on VENTOLIN HFA 108 (90 Base) MCG/ACT inhaler  [facility] [id number] - [place], [place] - [address] Phone:  [phone]  Fax:  [phone]

## 2021-10-27 NOTE — Telephone Encounter (Signed)
  Office: [phone]  /  Fax: [phone]  Date of Call: Oct 27, 2021  Time of Call: 12:02pm Provider: Lawerance Cruel, PsyD  CONTENT: This provider called Brynlynn to check-in as she did not present for today's MyChart Video Visit appointment at 12pm. A HIPAA compliant voicemail was left requesting a call back. Of note, this provider stayed on the MyChart Video Visit appointment for 5 minutes prior to signing off per the clinic's grace period policy.    PLAN: This provider will wait for Jayani to call back. No further follow-up planned by this provider.

## 2021-10-27 NOTE — Telephone Encounter (Signed)
Rx sent in as requested. Insurance only covers brand name Ventolin.

## 2021-11-05 ENCOUNTER — Encounter (INDEPENDENT_AMBULATORY_CARE_PROVIDER_SITE_OTHER): Payer: Self-pay | Admitting: Bariatrics

## 2021-11-05 ENCOUNTER — Ambulatory Visit (INDEPENDENT_AMBULATORY_CARE_PROVIDER_SITE_OTHER): Payer: BC Managed Care – PPO | Admitting: Bariatrics

## 2021-11-05 VITALS — BP 115/80 | HR 108 | Temp 98.1°F | Ht 62.0 in | Wt 228.0 lb

## 2021-11-05 DIAGNOSIS — R252 Cramp and spasm: Secondary | ICD-10-CM | POA: Diagnosis not present

## 2021-11-05 DIAGNOSIS — E785 Hyperlipidemia, unspecified: Secondary | ICD-10-CM

## 2021-11-05 DIAGNOSIS — E559 Vitamin D deficiency, unspecified: Secondary | ICD-10-CM | POA: Diagnosis not present

## 2021-11-05 DIAGNOSIS — E669 Obesity, unspecified: Secondary | ICD-10-CM | POA: Diagnosis not present

## 2021-11-05 DIAGNOSIS — Z6841 Body Mass Index (BMI) 40.0 and over, adult: Secondary | ICD-10-CM

## 2021-11-05 MED ORDER — VITAMIN D (ERGOCALCIFEROL) 1.25 MG (50000 UNIT) PO CAPS: 50000.0000 [IU] | ORAL_CAPSULE | ORAL | 0 refills | Status: DC

## 2021-11-09 NOTE — Progress Notes (Signed)
Chief Complaint:   OBESITY [name] is here to discuss her progress with her obesity treatment plan along with follow-up of her obesity related diagnoses. Catherine Lane is on the Category 4 Plan and states she is following her eating plan approximately 75-80% of the time. [name] states she is going to the gym for 30 minutes 2-3 times per week.  Today's visit was #: 3 Starting weight: 234 lbs Starting date: 09/30/2021 Today's weight: 228 lbs Today's date: 11/05/2021 Total lbs lost to date: 6 lbs Total lbs lost since last in-office visit: 1 lb  Interim History: Catherine Lane is down 1 additional pound since her last visit. She fell off  plan last week and she is getting back on track.   Subjective:   1. Vitamin D deficiency Catherine Lane is taking Vitamin D currently.   2. Dyslipidemia Catherine Lane is currently not on medications.   3. Leg cramps Catherine Lane is taking multi-vitamins and Biotin with Vitamins C and E currently.   Assessment/Plan:   1. Vitamin D deficiency Low Vitamin D level contributes to fatigue and are associated with obesity, breast, and colon cancer. We will refill prescription Vitamin D 50,000 IU every week for 1 month with no refills and Nada will follow-up for routine testing of Vitamin D, at least 2-3 times per year to avoid over-replacement.  - Vitamin D, Ergocalciferol, (DRISDOL) 1.25 MG (50000 UNIT) CAPS capsule; Take 1 capsule (50,000 Units total) by mouth every 7 (seven) days.  Dispense: 4 capsule; Refill: 0  2. Dyslipidemia Cardiovascular risk and specific lipid/LDL goals reviewed.  Catherine Lane will have no trans fats. She will keep saturated fats low except dairy and unprocessed meats. We discussed several lifestyle modifications today and Catherine Lane will continue to work on diet, exercise and weight loss efforts. Orders and follow up as documented in patient record.   Counseling Intensive lifestyle modifications are the first line treatment for this issue. Dietary changes: Increase soluble fiber. Decrease  simple carbohydrates. Exercise changes: Moderate to vigorous-intensity aerobic activity 150 minutes per week if tolerated. Lipid-lowering medications: see documented in medical record.  3. Leg cramps Catherine Lane agrees to start Magnesium OTC.Catherine Lane She will continue Vitamin D. She will stay hydrated.   4. Obesity, current BMI 41.8 Cathern is currently in the action stage of change. As such, her goal is to continue with weight loss efforts. She has agreed to the Category 4 Plan.   Catherine Lane will continue meal planning. She will adhere closely to the plan 80-90%.  Exercise goals:  Catherine Lane will continue exercising.  Behavioral modification strategies: increasing lean protein intake, decreasing simple carbohydrates, increasing vegetables, increasing water intake, decreasing eating out, no skipping meals, meal planning and cooking strategies, keeping healthy foods in the home, and planning for success.  Catherine Lane has agreed to follow-up with our clinic in 2-3 weeks. She was informed of the importance of frequent follow-up visits to maximize her success with intensive lifestyle modifications for her multiple health conditions.   Objective:   Blood pressure 115/80, pulse (!) 108, temperature 98.1 F (36.7 C), height 5\' 2"  (1.575 m), weight 228 lb (103.4 kg), SpO2 96 %. Body mass index is 41.7 kg/m.  General: Cooperative, alert, well developed, in no acute distress. HEENT: Conjunctivae and lids unremarkable. Cardiovascular: Regular rhythm.  Lungs: Normal work of breathing. Neurologic: No focal deficits.   Lab Results  Component Value Date   CREATININE 0.86 09/30/2021   BUN 8 09/30/2021   NA 136 09/30/2021   K 4.6 09/30/2021   CL  101 09/30/2021   CO2 19 (L) 09/30/2021   Lab Results  Component Value Date   ALT 29 09/30/2021   AST 22 09/30/2021   ALKPHOS 65 09/30/2021   BILITOT 0.4 09/30/2021   Lab Results  Component Value Date   HGBA1C 5.4 09/30/2021   HGBA1C 5.0 09/25/2019   HGBA1C 4.9 06/21/2016    HGBA1C 5.5 04/30/2014   Lab Results  Component Value Date   INSULIN 25.3 (H) 09/30/2021   Lab Results  Component Value Date   TSH 1.170 09/30/2021   Lab Results  Component Value Date   CHOL 182 09/30/2021   HDL 37 (L) 09/30/2021   LDLCALC 119 (H) 09/30/2021   TRIG 142 09/30/2021   Lab Results  Component Value Date   VD25OH 16.7 (L) 09/30/2021   VD25OH 12 (L) 06/21/2016   Lab Results  Component Value Date   WBC 9.9 04/01/2020   HGB 14.2 04/01/2020   HCT 42.2 04/01/2020   MCV 82.7 04/01/2020   PLT 473 (H) 04/01/2020   No results found for: "IRON", "TIBC", "FERRITIN"  Attestation Statements:   Reviewed by clinician on day of visit: allergies, medications, problem list, medical history, surgical history, family history, social history, and previous encounter notes.  I, Jackson Latino, RMA, am acting as Energy manager for [employer], DO.  I have reviewed the above documentation for accuracy and completeness, and I agree with the above. Corinna Capra, DO

## 2021-11-10 ENCOUNTER — Encounter (INDEPENDENT_AMBULATORY_CARE_PROVIDER_SITE_OTHER): Payer: Self-pay | Admitting: Bariatrics

## 2021-11-18 ENCOUNTER — Other Ambulatory Visit (INDEPENDENT_AMBULATORY_CARE_PROVIDER_SITE_OTHER): Payer: Self-pay | Admitting: Family Medicine

## 2021-11-18 DIAGNOSIS — E559 Vitamin D deficiency, unspecified: Secondary | ICD-10-CM

## 2021-11-24 ENCOUNTER — Ambulatory Visit (INDEPENDENT_AMBULATORY_CARE_PROVIDER_SITE_OTHER): Payer: BC Managed Care – PPO | Admitting: Bariatrics

## 2021-11-24 ENCOUNTER — Encounter (INDEPENDENT_AMBULATORY_CARE_PROVIDER_SITE_OTHER): Payer: Self-pay | Admitting: Bariatrics

## 2021-11-24 VITALS — BP 114/78 | HR 98 | Temp 98.0°F | Ht 62.0 in | Wt 230.0 lb

## 2021-11-24 DIAGNOSIS — E8881 Metabolic syndrome: Secondary | ICD-10-CM | POA: Diagnosis not present

## 2021-11-24 DIAGNOSIS — Z113 Encounter for screening for infections with a predominantly sexual mode of transmission: Secondary | ICD-10-CM | POA: Diagnosis not present

## 2021-11-24 DIAGNOSIS — Z6841 Body Mass Index (BMI) 40.0 and over, adult: Secondary | ICD-10-CM

## 2021-11-24 DIAGNOSIS — Z30017 Encounter for initial prescription of implantable subdermal contraceptive: Secondary | ICD-10-CM | POA: Diagnosis not present

## 2021-11-24 DIAGNOSIS — E559 Vitamin D deficiency, unspecified: Secondary | ICD-10-CM

## 2021-11-24 DIAGNOSIS — E669 Obesity, unspecified: Secondary | ICD-10-CM | POA: Diagnosis not present

## 2021-11-24 DIAGNOSIS — Z114 Encounter for screening for human immunodeficiency virus [HIV]: Secondary | ICD-10-CM | POA: Diagnosis not present

## 2021-11-24 MED ORDER — VITAMIN D (ERGOCALCIFEROL) 1.25 MG (50000 UNIT) PO CAPS: 50000.0000 [IU] | ORAL_CAPSULE | ORAL | 0 refills | Status: DC

## 2021-11-25 NOTE — Progress Notes (Unsigned)
Chief Complaint:   OBESITY Catherine Lane is here to discuss her progress with her obesity treatment plan along with follow-up of her obesity related diagnoses. Catherine Lane is on the Category 4 Plan and states she is following her eating plan approximately 70% of the time. Catherine Lane states she is doing cardio and weight lifting for 30 minutes 3 times per week.  Today's visit was #: 4 Starting weight: 234 lbs Starting date: 09/30/2021 Today's weight: 230 lbs Today's date: 11/24/2021 Total lbs lost to date: 4 Total lbs lost since last in-office visit: 0  Interim History: Catherine Lane is up 2 lbs since her last visit. She has been struggling with the plan (busy with too much work). She is getting adequate protein and water.   Subjective:   1. Vitamin D deficiency Catherine Lane is taking Vitamin D currently.  2. Insulin resistance Catherine Lane is not on medications.  Assessment/Plan:   1. Vitamin D deficiency We will refill prescription Vitamin D 50,000 IU weekly for 1 month.  - Vitamin D, Ergocalciferol, (DRISDOL) 1.25 MG (50000 UNIT) CAPS capsule; Take 1 capsule (50,000 Units total) by mouth every 7 (seven) days.  Dispense: 4 capsule; Refill: 0  2. Insulin resistance Catherine Lane will continue decreasing carbohydrates (starches and sweets).   3. Obesity, current BMI 42.2 Catherine Lane is currently in the action stage of change. As such, her goal is to continue with weight loss efforts. She has agreed to the Category 4 Plan or keeping a food journal and adhering to recommended goals of 1800 calories and 100-120 grams of protein daily.   Meal planning and intentional eating were discussed.  Eating out handout was given.  Exercise goals: As is.   Behavioral modification strategies: increasing lean protein intake, decreasing simple carbohydrates, increasing vegetables, increasing water intake, decreasing eating out, no skipping meals, meal planning and cooking strategies, keeping healthy foods in the home, and planning for success.  Catherine Lane  has agreed to follow-up with our clinic in 2 to 3 weeks. She was informed of the importance of frequent follow-up visits to maximize her success with intensive lifestyle modifications for her multiple health conditions.   Objective:   Blood pressure 114/78, pulse 98, temperature 98 F (36.7 C), height 5\' 2"  (1.575 m), weight 230 lb (104.3 kg), SpO2 98 %. Body mass index is 42.07 kg/m.  General: Cooperative, alert, well developed, in no acute distress. HEENT: Conjunctivae and lids unremarkable. Cardiovascular: Regular rhythm.  Lungs: Normal work of breathing. Neurologic: No focal deficits.   Lab Results  Component Value Date   CREATININE 0.86 09/30/2021   BUN 8 09/30/2021   NA 136 09/30/2021   K 4.6 09/30/2021   CL 101 09/30/2021   CO2 19 (L) 09/30/2021   Lab Results  Component Value Date   ALT 29 09/30/2021   AST 22 09/30/2021   ALKPHOS 65 09/30/2021   BILITOT 0.4 09/30/2021   Lab Results  Component Value Date   HGBA1C 5.4 09/30/2021   HGBA1C 5.0 09/25/2019   HGBA1C 4.9 06/21/2016   HGBA1C 5.5 04/30/2014   Lab Results  Component Value Date   INSULIN 25.3 (H) 09/30/2021   Lab Results  Component Value Date   TSH 1.170 09/30/2021   Lab Results  Component Value Date   CHOL 182 09/30/2021   HDL 37 (L) 09/30/2021   LDLCALC 119 (H) 09/30/2021   TRIG 142 09/30/2021   Lab Results  Component Value Date   VD25OH 16.7 (L) 09/30/2021   VD25OH 12 (L) 06/21/2016  Lab Results  Component Value Date   WBC 9.9 04/01/2020   HGB 14.2 04/01/2020   HCT 42.2 04/01/2020   MCV 82.7 04/01/2020   PLT 473 (H) 04/01/2020   No results found for: "IRON", "TIBC", "FERRITIN"  Attestation Statements:   Reviewed by clinician on day of visit: allergies, medications, problem list, medical history, surgical history, family history, social history, and previous encounter notes.   Trude Mcburney, am acting as Energy manager for [employer], DO.  I have reviewed the above  documentation for accuracy and completeness, and I agree with the above. Corinna Capra, DO

## 2021-11-26 ENCOUNTER — Encounter (INDEPENDENT_AMBULATORY_CARE_PROVIDER_SITE_OTHER): Payer: Self-pay | Admitting: Bariatrics

## 2021-12-02 ENCOUNTER — Other Ambulatory Visit: Payer: Self-pay | Admitting: Family Medicine

## 2021-12-15 ENCOUNTER — Other Ambulatory Visit (INDEPENDENT_AMBULATORY_CARE_PROVIDER_SITE_OTHER): Payer: Self-pay | Admitting: Bariatrics

## 2021-12-15 DIAGNOSIS — E559 Vitamin D deficiency, unspecified: Secondary | ICD-10-CM

## 2021-12-28 ENCOUNTER — Encounter (INDEPENDENT_AMBULATORY_CARE_PROVIDER_SITE_OTHER): Payer: Self-pay | Admitting: Bariatrics

## 2021-12-28 ENCOUNTER — Ambulatory Visit (INDEPENDENT_AMBULATORY_CARE_PROVIDER_SITE_OTHER): Payer: BC Managed Care – PPO | Admitting: Bariatrics

## 2021-12-28 VITALS — BP 114/78 | HR 91 | Temp 99.0°F | Ht 62.0 in | Wt 233.0 lb

## 2021-12-28 DIAGNOSIS — E559 Vitamin D deficiency, unspecified: Secondary | ICD-10-CM

## 2021-12-28 DIAGNOSIS — Z6841 Body Mass Index (BMI) 40.0 and over, adult: Secondary | ICD-10-CM | POA: Diagnosis not present

## 2021-12-28 DIAGNOSIS — E669 Obesity, unspecified: Secondary | ICD-10-CM | POA: Diagnosis not present

## 2021-12-28 DIAGNOSIS — E785 Hyperlipidemia, unspecified: Secondary | ICD-10-CM

## 2021-12-28 MED ORDER — VITAMIN D (ERGOCALCIFEROL) 1.25 MG (50000 UNIT) PO CAPS: 50000.0000 [IU] | ORAL_CAPSULE | ORAL | 0 refills | Status: DC

## 2022-01-06 ENCOUNTER — Encounter (INDEPENDENT_AMBULATORY_CARE_PROVIDER_SITE_OTHER): Payer: Self-pay

## 2022-01-06 ENCOUNTER — Encounter (INDEPENDENT_AMBULATORY_CARE_PROVIDER_SITE_OTHER): Payer: Self-pay | Admitting: Bariatrics

## 2022-01-06 NOTE — Progress Notes (Signed)
Chief Complaint:   OBESITY Catherine Lane is here to discuss her progress with her obesity treatment plan along with follow-up of her obesity related diagnoses. Catherine Lane is on the Category 4 Plan and keeping a food journal and adhering to recommended goals of 1800 calories and 100-120 grams of protein daily and states she is following her eating plan approximately 50-60% of the time. Catherine Lane states she is doing cardio and weights for 20-30 minutes 2-3 times per week.  Today's visit was #: 5 Starting weight: 234 lbs Starting date: 09/30/2021 Today's weight: 233 lbs Today's date: 12/28/2021 Total lbs lost to date: 1 Total lbs lost since last in-office visit: 0  Interim History: [name] is up 4 lbs since her last visit. She is moving at this time. She is eating out more.   Subjective:   1. Vitamin D deficiency Catherine Lane is taking prescription Vitamin D.   2. Dyslipidemia Catherine Lane is not on medications currently.  Assessment/Plan:   1. Vitamin D deficiency Catherine Lane will continue prescription Vitamin D 50,000 IU once weekly, and we will refill for 1 month.  - Vitamin D, Ergocalciferol, (DRISDOL) 1.25 MG (50000 UNIT) CAPS capsule; Take 1 capsule (50,000 Units total) by mouth every 7 (seven) days.  Dispense: 4 capsule; Refill: 0  2. Dyslipidemia Catherine Lane is to eliminate trans fats, and increase PUFAs and MUFAs.   3. Obesity, current BMI 42.7 Catherine Lane is currently in the action stage of change. As such, her goal is to continue with weight loss efforts. She has agreed to the Category 4 Plan and keeping a food journal and adhering to recommended goals of 1800 calories and 100-120 grams of protein daily.   Mindful eating was discussed. She is to keep her protein an water intake high. She will adhere to the meal plan 80-90%.   Exercise goals: As is, increase.  Behavioral modification strategies: increasing lean protein intake, decreasing simple carbohydrates, increasing vegetables, increasing water intake, decreasing eating  out, no skipping meals, meal planning and cooking strategies, keeping healthy foods in the home, and planning for success.  Catherine Lane has agreed to follow-up with our clinic in 2 to 3 weeks. She was informed of the importance of frequent follow-up visits to maximize her success with intensive lifestyle modifications for her multiple health conditions.   Objective:   Blood pressure 114/78, pulse 91, temperature 99 F (37.2 C), height 5\' 2"  (1.575 m), weight 233 lb (105.7 kg), SpO2 95 %. Body mass index is 42.62 kg/m.  General: Cooperative, alert, well developed, in no acute distress. HEENT: Conjunctivae and lids unremarkable. Cardiovascular: Regular rhythm.  Lungs: Normal work of breathing. Neurologic: No focal deficits.   Lab Results  Component Value Date   CREATININE 0.86 09/30/2021   BUN 8 09/30/2021   NA 136 09/30/2021   K 4.6 09/30/2021   CL 101 09/30/2021   CO2 19 (L) 09/30/2021   Lab Results  Component Value Date   ALT 29 09/30/2021   AST 22 09/30/2021   ALKPHOS 65 09/30/2021   BILITOT 0.4 09/30/2021   Lab Results  Component Value Date   HGBA1C 5.4 09/30/2021   HGBA1C 5.0 09/25/2019   HGBA1C 4.9 06/21/2016   HGBA1C 5.5 04/30/2014   Lab Results  Component Value Date   INSULIN 25.3 (H) 09/30/2021   Lab Results  Component Value Date   TSH 1.170 09/30/2021   Lab Results  Component Value Date   CHOL 182 09/30/2021   HDL 37 (L) 09/30/2021   LDLCALC 119 (  H) 09/30/2021   TRIG 142 09/30/2021   Lab Results  Component Value Date   VD25OH 16.7 (L) 09/30/2021   VD25OH 12 (L) 06/21/2016   Lab Results  Component Value Date   WBC 9.9 04/01/2020   HGB 14.2 04/01/2020   HCT 42.2 04/01/2020   MCV 82.7 04/01/2020   PLT 473 (H) 04/01/2020   No results found for: "IRON", "TIBC", "FERRITIN"  Attestation Statements:   Reviewed by clinician on day of visit: allergies, medications, problem list, medical history, surgical history, family history, social history, and  previous encounter notes.   Trude Mcburney, am acting as Energy manager for [employer], DO.  I have reviewed the above documentation for accuracy and completeness, and I agree with the above. Corinna Capra, DO

## 2022-01-25 ENCOUNTER — Ambulatory Visit (INDEPENDENT_AMBULATORY_CARE_PROVIDER_SITE_OTHER): Payer: BC Managed Care – PPO | Admitting: Bariatrics

## 2022-01-25 ENCOUNTER — Encounter (INDEPENDENT_AMBULATORY_CARE_PROVIDER_SITE_OTHER): Payer: Self-pay | Admitting: Bariatrics

## 2022-01-25 VITALS — BP 113/73 | HR 81 | Temp 98.4°F | Ht 62.0 in | Wt 230.0 lb

## 2022-01-25 DIAGNOSIS — F902 Attention-deficit hyperactivity disorder, combined type: Secondary | ICD-10-CM | POA: Diagnosis not present

## 2022-01-25 DIAGNOSIS — E669 Obesity, unspecified: Secondary | ICD-10-CM | POA: Diagnosis not present

## 2022-01-25 DIAGNOSIS — Z6841 Body Mass Index (BMI) 40.0 and over, adult: Secondary | ICD-10-CM

## 2022-01-25 DIAGNOSIS — F331 Major depressive disorder, recurrent, moderate: Secondary | ICD-10-CM | POA: Diagnosis not present

## 2022-01-25 DIAGNOSIS — F411 Generalized anxiety disorder: Secondary | ICD-10-CM | POA: Diagnosis not present

## 2022-01-25 DIAGNOSIS — E8881 Metabolic syndrome: Secondary | ICD-10-CM

## 2022-01-25 DIAGNOSIS — E559 Vitamin D deficiency, unspecified: Secondary | ICD-10-CM | POA: Diagnosis not present

## 2022-01-25 DIAGNOSIS — F429 Obsessive-compulsive disorder, unspecified: Secondary | ICD-10-CM | POA: Diagnosis not present

## 2022-01-25 MED ORDER — VITAMIN D (ERGOCALCIFEROL) 1.25 MG (50000 UNIT) PO CAPS: 50000.0000 [IU] | ORAL_CAPSULE | ORAL | 0 refills | Status: DC

## 2022-01-28 ENCOUNTER — Encounter (INDEPENDENT_AMBULATORY_CARE_PROVIDER_SITE_OTHER): Payer: Self-pay | Admitting: Bariatrics

## 2022-01-28 NOTE — Progress Notes (Signed)
Chief Complaint:   OBESITY Catherine Lane is here to discuss her progress with her obesity treatment plan along with follow-up of her obesity related diagnoses. Catherine Lane is on the Category 4 Plan and states she is following her eating plan approximately 65-70% of the time. Catherine Lane states she is exercising at the gym for 30 to 60 minutes 2-3 times per week.  Today's visit was #: 6 Starting weight: 234 lbs Starting date: 09/30/2021 Today's weight: 230 lbs Today's date: 01/25/22 Total lbs lost to date: 4 Total lbs lost since last in-office visit: -3  Interim History: She is down 3 pounds since her last visit.  She is getting in her protein and water.  Subjective:   1. Vitamin D deficiency Taking vitamin D.  2. Insulin resistance No medications.  Assessment/Plan:   1. Vitamin D deficiency Refill - Vitamin D, Ergocalciferol, (DRISDOL) 1.25 MG (50000 UNIT) CAPS capsule; Take 1 capsule (50,000 Units total) by mouth every 7 (seven) days.  Dispense: 4 capsule; Refill: 0  2. Insulin resistance 1.  Will minimize carbohydrates (starches and sweets).  3. Obesity, current BMI 42.1 1.  Meal planning 2.  Intentional eating.  Catherine Lane is currently in the action stage of change. As such, her goal is to continue with weight loss efforts. She has agreed to the Category 4 Plan.   Exercise goals: Going to the gym.  Behavioral modification strategies: increasing lean protein intake, decreasing simple carbohydrates, increasing vegetables, increasing water intake, decreasing eating out, no skipping meals, meal planning and cooking strategies, and keeping healthy foods in the home.  Catherine Lane has agreed to follow-up with our clinic in 3-4 weeks, fasting with Dr. Cathey Endow or Orpha Bur. She was informed of the importance of frequent follow-up visits to maximize her success with intensive lifestyle modifications for her multiple health conditions.    Objective:   Blood pressure 113/73, pulse 81, temperature 98.4 F (36.9 C),  height 5\' 2"  (1.575 m), weight 230 lb (104.3 kg), SpO2 98 %. Body mass index is 42.07 kg/m.  General: Cooperative, alert, well developed, in no acute distress. HEENT: Conjunctivae and lids unremarkable. Cardiovascular: Regular rhythm.  Lungs: Normal work of breathing. Neurologic: No focal deficits.   Lab Results  Component Value Date   CREATININE 0.86 09/30/2021   BUN 8 09/30/2021   NA 136 09/30/2021   K 4.6 09/30/2021   CL 101 09/30/2021   CO2 19 (L) 09/30/2021   Lab Results  Component Value Date   ALT 29 09/30/2021   AST 22 09/30/2021   ALKPHOS 65 09/30/2021   BILITOT 0.4 09/30/2021   Lab Results  Component Value Date   HGBA1C 5.4 09/30/2021   HGBA1C 5.0 09/25/2019   HGBA1C 4.9 06/21/2016   HGBA1C 5.5 04/30/2014   Lab Results  Component Value Date   INSULIN 25.3 (H) 09/30/2021   Lab Results  Component Value Date   TSH 1.170 09/30/2021   Lab Results  Component Value Date   CHOL 182 09/30/2021   HDL 37 (L) 09/30/2021   LDLCALC 119 (H) 09/30/2021   TRIG 142 09/30/2021   Lab Results  Component Value Date   VD25OH 16.7 (L) 09/30/2021   VD25OH 12 (L) 06/21/2016   Lab Results  Component Value Date   WBC 9.9 04/01/2020   HGB 14.2 04/01/2020   HCT 42.2 04/01/2020   MCV 82.7 04/01/2020   PLT 473 (H) 04/01/2020   No results found for: "IRON", "TIBC", "FERRITIN"   Attestation Statements:   Reviewed by  clinician on day of visit: allergies, medications, problem list, medical history, surgical history, family history, social history, and previous encounter notes.  I, Dawn Whitmire, FNP-C, am acting as transcriptionist for Dr. Corinna Capra.  I have reviewed the above documentation for accuracy and completeness, and I agree with the above. Corinna Capra, DO

## 2022-02-24 ENCOUNTER — Encounter (INDEPENDENT_AMBULATORY_CARE_PROVIDER_SITE_OTHER): Payer: Self-pay

## 2022-02-24 ENCOUNTER — Ambulatory Visit (INDEPENDENT_AMBULATORY_CARE_PROVIDER_SITE_OTHER): Payer: BC Managed Care – PPO | Admitting: Adult Health

## 2022-02-25 ENCOUNTER — Ambulatory Visit (INDEPENDENT_AMBULATORY_CARE_PROVIDER_SITE_OTHER): Payer: BC Managed Care – PPO | Admitting: Adult Health

## 2022-02-25 ENCOUNTER — Encounter (INDEPENDENT_AMBULATORY_CARE_PROVIDER_SITE_OTHER): Payer: Self-pay | Admitting: Adult Health

## 2022-02-25 VITALS — BP 122/83 | HR 80 | Temp 98.4°F | Ht 62.0 in | Wt 230.0 lb

## 2022-02-25 DIAGNOSIS — E669 Obesity, unspecified: Secondary | ICD-10-CM | POA: Diagnosis not present

## 2022-02-25 DIAGNOSIS — E7849 Other hyperlipidemia: Secondary | ICD-10-CM | POA: Diagnosis not present

## 2022-02-25 DIAGNOSIS — E559 Vitamin D deficiency, unspecified: Secondary | ICD-10-CM

## 2022-02-25 DIAGNOSIS — E8881 Metabolic syndrome: Secondary | ICD-10-CM | POA: Diagnosis not present

## 2022-02-25 DIAGNOSIS — Z6841 Body Mass Index (BMI) 40.0 and over, adult: Secondary | ICD-10-CM

## 2022-02-25 DIAGNOSIS — E785 Hyperlipidemia, unspecified: Secondary | ICD-10-CM | POA: Diagnosis not present

## 2022-02-25 MED ORDER — VITAMIN D (ERGOCALCIFEROL) 1.25 MG (50000 UNIT) PO CAPS: 50000.0000 [IU] | ORAL_CAPSULE | ORAL | 0 refills | Status: DC

## 2022-02-26 LAB — LIPID PANEL
Chol/HDL Ratio: 4.2 ratio (ref 0.0–4.4)
Cholesterol, Total: 171 mg/dL (ref 100–199)
HDL: 41 mg/dL (ref >39)
LDL Chol Calc (NIH): 109 mg/dL — ABNORMAL HIGH (ref 0–99)
Triglycerides: 118 mg/dL (ref 0–149)
VLDL Cholesterol Cal: 21 mg/dL (ref 5–40)

## 2022-02-26 LAB — COMPREHENSIVE METABOLIC PANEL
ALT: 41 IU/L — ABNORMAL HIGH (ref 0–32)
AST: 30 IU/L (ref 0–40)
Albumin/Globulin Ratio: 1.5 (ref 1.2–2.2)
Albumin: 4.4 g/dL (ref 4.0–5.0)
Alkaline Phosphatase: 69 IU/L (ref 44–121)
BUN/Creatinine Ratio: 8 — ABNORMAL LOW (ref 9–23)
BUN: 8 mg/dL (ref 6–20)
Bilirubin Total: 0.7 mg/dL (ref 0.0–1.2)
CO2: 20 mmol/L (ref 20–29)
Calcium: 9.7 mg/dL (ref 8.7–10.2)
Chloride: 102 mmol/L (ref 96–106)
Creatinine, Ser: 1.05 mg/dL — ABNORMAL HIGH (ref 0.57–1.00)
Globulin, Total: 3 g/dL (ref 1.5–4.5)
Glucose: 85 mg/dL (ref 70–99)
Potassium: 4.6 mmol/L (ref 3.5–5.2)
Sodium: 138 mmol/L (ref 134–144)
Total Protein: 7.4 g/dL (ref 6.0–8.5)
eGFR: 76 mL/min/{1.73_m2} (ref >59)

## 2022-02-26 LAB — VITAMIN D 25 HYDROXY (VIT D DEFICIENCY, FRACTURES): Vit D, 25-Hydroxy: 20.3 ng/mL — ABNORMAL LOW (ref 30.0–100.0)

## 2022-02-26 LAB — HEMOGLOBIN A1C
Est. average glucose Bld gHb Est-mCnc: 108 mg/dL
Hgb A1c MFr Bld: 5.4 % (ref 4.8–5.6)

## 2022-02-26 LAB — INSULIN, RANDOM: INSULIN: 37.6 u[IU]/mL — ABNORMAL HIGH (ref 2.6–24.9)

## 2022-02-27 NOTE — Progress Notes (Unsigned)
Chief Complaint:   OBESITY Catherine Lane is here to discuss her progress with her obesity treatment plan along with follow-up of her obesity related diagnoses. Catherine Lane is on the Category 4 Plan and states she is following her eating plan approximately 70% of the time. Catherine Lane states she is not exercising.   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   She provided the following intake that is typical of a daily intake: Breakfast:  (consumed at work) protein shake, toast with peanut butter. Lunch:  frozen meal, fast food, [employer] (chicken nuggets, french fries, diet coke) or [employer],(french fries, 2 chicken quesadilla, coke) Dinner:  chicken, rice, vegetables.   Subjective:   1. Vitamin D deficiency 09/30/2021, Vitamin D level 16.7- well below goal of 50-70.  2. Insulin resistance History of PCOS.   She has never been on metformin.   3. Other hyperlipidemia Stopped marijuana use >3 years.   She will occasionally vape nicotine.    Assessment/Plan:   1. Vitamin D deficiency Refill - Vitamin D, Ergocalciferol, (DRISDOL) 1.25 MG (50000 UNIT) CAPS capsule; Take 1 capsule (50,000 Units total) by mouth every 7 (seven) days.  Dispense: 4 capsule; Refill: 0  Check labs- VITAMIN D 25 Hydroxy (Vit-D Deficiency, Fractures)  2. Insulin resistance Check labs today.   - Comprehensive metabolic panel - Insulin, random - Hemoglobin A1c  3. Other hyperlipidemia Check labs today.   - Lipid panel  4. Obesity, current BMI 42.1 Catherine Lane is currently in the action stage of change. As such, her goal is to continue with weight loss efforts. She has agreed to the Category 4 Plan.   Exercise goals:  As is.   Behavioral  modification strategies: increasing lean protein intake, decreasing simple carbohydrates, meal planning and cooking strategies, keeping healthy foods in the home, and planning for success.  Catherine Lane has agreed to follow-up with our clinic in 2 weeks. She was informed of the importance of frequent follow-up visits to maximize her success with intensive lifestyle modifications for her multiple health conditions.   Catherine Lane was informed we would discuss her lab results at her next visit unless there is a critical issue that needs to be addressed sooner. Catherine Lane agreed to keep her next visit at the agreed upon time to discuss these results.  Objective:   Blood pressure 122/83, pulse 80, temperature 98.4 F (36.9 C), height 5\' 2"  (1.575 m), weight 230 lb (104.3 kg), SpO2 96 %. Body mass index is 42.07 kg/m.  General: Cooperative, alert, well developed, in no acute distress. HEENT: Conjunctivae and lids unremarkable. Cardiovascular: Regular rhythm.  Lungs: Normal work of breathing. Neurologic: No focal deficits.   Lab Results  Component Value Date   CREATININE 1.05 (H) 02/25/2022   BUN 8 02/25/2022   NA 138 02/25/2022   K 4.6 02/25/2022   CL 102 02/25/2022   CO2 20 02/25/2022   Lab Results  Component Value Date   ALT 41 (H) 02/25/2022   AST 30 02/25/2022   ALKPHOS 69 02/25/2022   BILITOT 0.7 02/25/2022   Lab Results  Component Value Date   HGBA1C 5.4 02/25/2022   HGBA1C 5.4 09/30/2021   HGBA1C 5.0 09/25/2019   HGBA1C 4.9 06/21/2016  HGBA1C 5.5 04/30/2014   Lab Results  Component Value Date   INSULIN 37.6 (H) 02/25/2022   INSULIN 25.3 (H) 09/30/2021   Lab Results  Component Value Date   TSH 1.170 09/30/2021   Lab Results  Component Value Date   CHOL 171 02/25/2022   HDL 41 02/25/2022   LDLCALC 109 (H) 02/25/2022   TRIG 118 02/25/2022   CHOLHDL 4.2 02/25/2022   Lab Results  Component Value Date   VD25OH 20.3 (L) 02/25/2022   VD25OH 16.7 (L) 09/30/2021   VD25OH 12 (L)  06/21/2016   Lab Results  Component Value Date   WBC 9.9 04/01/2020   HGB 14.2 04/01/2020   HCT 42.2 04/01/2020   MCV 82.7 04/01/2020   PLT 473 (H) 04/01/2020   No results found for: "IRON", "TIBC", "FERRITIN"  Attestation Statements:   Reviewed by clinician on day of visit: allergies, medications, problem list, medical history, surgical history, family history, social history, and previous encounter notes.  I, Davy Pique, RMA, am acting as Location manager for Mina Marble, NP.  I have reviewed the above documentation for accuracy and completeness, and I agree with the above. -  Katy d. Danford, NP-C

## 2022-03-18 ENCOUNTER — Ambulatory Visit (INDEPENDENT_AMBULATORY_CARE_PROVIDER_SITE_OTHER): Payer: BC Managed Care – PPO | Admitting: Adult Health

## 2022-03-18 ENCOUNTER — Encounter (INDEPENDENT_AMBULATORY_CARE_PROVIDER_SITE_OTHER): Payer: Self-pay | Admitting: Adult Health

## 2022-03-18 VITALS — BP 128/83 | HR 96 | Temp 98.3°F | Ht 62.0 in | Wt 235.0 lb

## 2022-03-18 DIAGNOSIS — E669 Obesity, unspecified: Secondary | ICD-10-CM

## 2022-03-18 DIAGNOSIS — R7401 Elevation of levels of liver transaminase levels: Secondary | ICD-10-CM

## 2022-03-18 DIAGNOSIS — E88819 Insulin resistance, unspecified: Secondary | ICD-10-CM

## 2022-03-18 DIAGNOSIS — E559 Vitamin D deficiency, unspecified: Secondary | ICD-10-CM

## 2022-03-18 DIAGNOSIS — Z6841 Body Mass Index (BMI) 40.0 and over, adult: Secondary | ICD-10-CM

## 2022-03-18 MED ORDER — SAXENDA 18 MG/3ML ~~LOC~~ SOPN: PEN_INJECTOR | SUBCUTANEOUS | 0 refills | Status: DC

## 2022-03-18 MED ORDER — VITAMIN D (ERGOCALCIFEROL) 1.25 MG (50000 UNIT) PO CAPS: ORAL_CAPSULE | ORAL | 0 refills | Status: DC

## 2022-03-25 NOTE — Progress Notes (Signed)
Chief Complaint:   OBESITY Catherine Lane is here to discuss her progress with her obesity treatment plan along with follow-up of her obesity related diagnoses. Catherine Lane is on the Category 4 Plan and states she is following her eating plan approximately 60-70% of the time. Catherine Lane states she is doing cardio 30 minutes 2-3 times per week.  Today's visit was #: 8 Starting weight: 234 lbs Starting date: 09/30/2021 Today's weight: 235 lbs Today's date: 03/18/2022 Total lbs lost to date: 0 Total lbs lost since last in-office visit: +5 lbs  Interim History:  Since last office visit:   1) Attended a wedding  2) Experienced increased fatigue  3) Worsening depression symptoms, lack of motivation, sadness, tearfulness.  No suicidal or homicidal ideations.    Of note:  Her Pchiatrist provided samples of Saxenda. She denies family history of MTC or MENS 2.  She denies personal hx of pancreatitis.  Subjective:   1. Vitamin D deficiency Discussed labs with patient today. 02/25/2022, Vitamin D level, 20.3, subtherapeutic level despite weekly ergocalciferol.   2. Transaminitis New diagnosis.  Discussed labs with patient today. 02/25/2022, CMP ALT 41.  She denies RUQ pain. She has not had abdominal ultrasound in past.  3. Insulin resistance Worsening. Discussed labs with patient today. 02/25/2022, blood glucose 85 A1c 5.4, Insulin level 37.6.   Previously on Saxenda, tolerated well. Saxenda samples provided by her Psychiatrist.   Assessment/Plan:   1. Vitamin D deficiency Increase - Vitamin D, Ergocalciferol, (DRISDOL) 1.25 MG (50000 UNIT) CAPS capsule; One cap twice weekly  Dispense: 8 capsule; Refill: 0  2. Transaminitis Continue with weight loss efforts.   3. Insulin resistance GLP-1 therapy if not covered, then consider metformin therapy.   4. Obesity, current BMI 43.1 Refill - Liraglutide -Weight Management (SAXENDA) 18 MG/3ML SOPN; 0.6mg  daily injection for one week then increase  1.2mg  daily injection- hold at this dose.  Dispense: 3 mL; Refill: 0  Catherine Lane is currently in the action stage of change. As such, her goal is to continue with weight loss efforts. She has agreed to the Category 4 Plan.   Exercise goals:  As is.   Behavioral modification strategies: increasing lean protein intake, decreasing simple carbohydrates, meal planning and cooking strategies, keeping healthy foods in the home, and planning for success.  Catherine Lane has agreed to follow-up with our clinic in 4 weeks. She was informed of the importance of frequent follow-up visits to maximize her success with intensive lifestyle modifications for her multiple health conditions.   Objective:   Blood pressure 128/83, pulse 96, temperature 98.3 F (36.8 C), height 5\' 2"  (1.575 m), weight 235 lb (106.6 kg), SpO2 98 %. Body mass index is 42.98 kg/m.  General: Cooperative, alert, well developed, in no acute distress. HEENT: Conjunctivae and lids unremarkable. Cardiovascular: Regular rhythm.  Lungs: Normal work of breathing. Neurologic: No focal deficits.   Lab Results  Component Value Date   CREATININE 1.05 (H) 02/25/2022   BUN 8 02/25/2022   NA 138 02/25/2022   K 4.6 02/25/2022   CL 102 02/25/2022   CO2 20 02/25/2022   Lab Results  Component Value Date   ALT 41 (H) 02/25/2022   AST 30 02/25/2022   ALKPHOS 69 02/25/2022   BILITOT 0.7 02/25/2022   Lab Results  Component Value Date   HGBA1C 5.4 02/25/2022   HGBA1C 5.4 09/30/2021   HGBA1C 5.0 09/25/2019   HGBA1C 4.9 06/21/2016   HGBA1C 5.5 04/30/2014   Lab Results  Component  Value Date   INSULIN 37.6 (H) 02/25/2022   INSULIN 25.3 (H) 09/30/2021   Lab Results  Component Value Date   TSH 1.170 09/30/2021   Lab Results  Component Value Date   CHOL 171 02/25/2022   HDL 41 02/25/2022   LDLCALC 109 (H) 02/25/2022   TRIG 118 02/25/2022   CHOLHDL 4.2 02/25/2022   Lab Results  Component Value Date   VD25OH 20.3 (L) 02/25/2022   VD25OH  16.7 (L) 09/30/2021   VD25OH 12 (L) 06/21/2016   Lab Results  Component Value Date   WBC 9.9 04/01/2020   HGB 14.2 04/01/2020   HCT 42.2 04/01/2020   MCV 82.7 04/01/2020   PLT 473 (H) 04/01/2020   No results found for: "IRON", "TIBC", "FERRITIN"  Attestation Statements:   Reviewed by clinician on day of visit: allergies, medications, problem list, medical history, surgical history, family history, social history, and previous encounter notes.  I, Davy Pique, RMA, am acting as Location manager for Mina Marble, NP.  I have reviewed the above documentation for accuracy and completeness, and I agree with the above. -  Catherine Lane d. Catherine Charters, NP-C

## 2022-04-15 ENCOUNTER — Ambulatory Visit (INDEPENDENT_AMBULATORY_CARE_PROVIDER_SITE_OTHER): Payer: BC Managed Care – PPO | Admitting: Family Medicine

## 2022-04-19 ENCOUNTER — Telehealth (INDEPENDENT_AMBULATORY_CARE_PROVIDER_SITE_OTHER): Payer: Self-pay | Admitting: Family Medicine

## 2022-04-19 ENCOUNTER — Other Ambulatory Visit (INDEPENDENT_AMBULATORY_CARE_PROVIDER_SITE_OTHER): Payer: Self-pay | Admitting: Family Medicine

## 2022-04-19 ENCOUNTER — Encounter (INDEPENDENT_AMBULATORY_CARE_PROVIDER_SITE_OTHER): Payer: Self-pay | Admitting: Family Medicine

## 2022-04-19 ENCOUNTER — Ambulatory Visit (INDEPENDENT_AMBULATORY_CARE_PROVIDER_SITE_OTHER): Payer: BC Managed Care – PPO | Admitting: Family Medicine

## 2022-04-19 VITALS — BP 117/77 | HR 103 | Temp 98.4°F | Ht 62.0 in | Wt 238.0 lb

## 2022-04-19 DIAGNOSIS — Z6841 Body Mass Index (BMI) 40.0 and over, adult: Secondary | ICD-10-CM | POA: Diagnosis not present

## 2022-04-19 DIAGNOSIS — E559 Vitamin D deficiency, unspecified: Secondary | ICD-10-CM

## 2022-04-19 DIAGNOSIS — F429 Obsessive-compulsive disorder, unspecified: Secondary | ICD-10-CM | POA: Diagnosis not present

## 2022-04-19 DIAGNOSIS — E669 Obesity, unspecified: Secondary | ICD-10-CM | POA: Diagnosis not present

## 2022-04-19 DIAGNOSIS — E88819 Insulin resistance, unspecified: Secondary | ICD-10-CM

## 2022-04-19 DIAGNOSIS — F411 Generalized anxiety disorder: Secondary | ICD-10-CM | POA: Diagnosis not present

## 2022-04-19 DIAGNOSIS — F902 Attention-deficit hyperactivity disorder, combined type: Secondary | ICD-10-CM | POA: Diagnosis not present

## 2022-04-19 DIAGNOSIS — F331 Major depressive disorder, recurrent, moderate: Secondary | ICD-10-CM | POA: Diagnosis not present

## 2022-04-19 MED ORDER — METFORMIN HCL 500 MG PO TABS: 500.0000 mg | ORAL_TABLET | Freq: Every day | ORAL | 0 refills | Status: DC

## 2022-04-19 MED ORDER — VITAMIN D (ERGOCALCIFEROL) 1.25 MG (50000 UNIT) PO CAPS: ORAL_CAPSULE | ORAL | 0 refills | Status: DC

## 2022-04-28 NOTE — Progress Notes (Signed)
Chief Complaint:   OBESITY Catherine Lane is here to discuss her progress with her obesity treatment plan along with follow-up of her obesity related diagnoses. Catherine Lane is on the Category 4 Plan and states she is following her eating plan approximately 70% of the time. Catherine Lane states she is walking 20-30 minutes 3-4 times per week.  Today's visit was #: 9 Starting weight: 234 lbs Starting date: 09/30/2021 Today's weight: 238 lbs Today's date: 04/19/2022 Total lbs lost to date: 0 lbs Total lbs lost since last in-office visit: 0  Interim History: Catherine Lane has been trying to stick to plan as much as she can. She is aiming for 115g protein a day. Going to her parents house for Thanksgiving. Does think Cat 4 will still be doable. Snacks is often string cheese/Babybel cheeses.  Subjective:   1. Vitamin D deficiency Catherine Lane is currently taking prescription Vit D 50,000 IU once a week. Denies any nausea, vomiting or muscle weakness. She notes fatigue.  2. Insulin resistance Catherine Lane's last A1c was 5.4, Insulin was 37.6. She is not on medication.  Assessment/Plan:   1. Vitamin D deficiency We will refill Vit D 50K IU once a week for 1 month with 0 refills.  -Refill Vitamin D, Ergocalciferol, (DRISDOL) 1.25 MG (50000 UNIT) CAPS capsule; One cap twice weekly  Dispense: 8 capsule; Refill: 0  2. Insulin resistance Start Metformin 500 mg daily for 1 month with 0 refills. Will repeat labs in 2 months.  -Start metFORMIN (GLUCOPHAGE) 500 MG tablet; Take 1 tablet (500 mg total) by mouth daily with breakfast.  Dispense: 30 tablet; Refill: 0  3. Obesity, current BMI 43.6 Catherine Lane is currently in the action stage of change. As such, her goal is to continue with weight loss efforts. She has agreed to the Category 4 Plan.   Exercise goals: All adults should avoid inactivity. Some physical activity is better than none, and adults who participate in any amount of physical activity gain some health benefits.  Behavioral  modification strategies: increasing lean protein intake, meal planning and cooking strategies, keeping healthy foods in the home, holiday eating strategies , and planning for success.  Catherine Lane has agreed to follow-up with our clinic in 3 weeks. She was informed of the importance of frequent follow-up visits to maximize her success with intensive lifestyle modifications for her multiple health conditions.   Objective:   Blood pressure 117/77, pulse (!) 103, temperature 98.4 F (36.9 C), height 5\' 2"  (1.575 m), weight 238 lb (108 kg), SpO2 96 %. Body mass index is 43.53 kg/m.  General: Cooperative, alert, well developed, in no acute distress. HEENT: Conjunctivae and lids unremarkable. Cardiovascular: Regular rhythm.  Lungs: Normal work of breathing. Neurologic: No focal deficits.   Lab Results  Component Value Date   CREATININE 1.05 (H) 02/25/2022   BUN 8 02/25/2022   NA 138 02/25/2022   K 4.6 02/25/2022   CL 102 02/25/2022   CO2 20 02/25/2022   Lab Results  Component Value Date   ALT 41 (H) 02/25/2022   AST 30 02/25/2022   ALKPHOS 69 02/25/2022   BILITOT 0.7 02/25/2022   Lab Results  Component Value Date   HGBA1C 5.4 02/25/2022   HGBA1C 5.4 09/30/2021   HGBA1C 5.0 09/25/2019   HGBA1C 4.9 06/21/2016   HGBA1C 5.5 04/30/2014   Lab Results  Component Value Date   INSULIN 37.6 (H) 02/25/2022   INSULIN 25.3 (H) 09/30/2021   Lab Results  Component Value Date   TSH 1.170  09/30/2021   Lab Results  Component Value Date   CHOL 171 02/25/2022   HDL 41 02/25/2022   LDLCALC 109 (H) 02/25/2022   TRIG 118 02/25/2022   CHOLHDL 4.2 02/25/2022   Lab Results  Component Value Date   VD25OH 20.3 (L) 02/25/2022   VD25OH 16.7 (L) 09/30/2021   VD25OH 12 (L) 06/21/2016   Lab Results  Component Value Date   WBC 9.9 04/01/2020   HGB 14.2 04/01/2020   HCT 42.2 04/01/2020   MCV 82.7 04/01/2020   PLT 473 (H) 04/01/2020   No results found for: "IRON", "TIBC",  "FERRITIN"  Attestation Statements:   Reviewed by clinician on day of visit: allergies, medications, problem list, medical history, surgical history, family history, social history, and previous encounter notes.  I, Fortino Sic, RMA am acting as transcriptionist for Reuben Likes, MD. I have reviewed the above documentation for accuracy and completeness, and I agree with the above. - Reuben Likes, MD

## 2022-05-13 ENCOUNTER — Encounter (INDEPENDENT_AMBULATORY_CARE_PROVIDER_SITE_OTHER): Payer: Self-pay | Admitting: Family Medicine

## 2022-05-13 ENCOUNTER — Other Ambulatory Visit (INDEPENDENT_AMBULATORY_CARE_PROVIDER_SITE_OTHER): Payer: Self-pay | Admitting: Family Medicine

## 2022-05-13 ENCOUNTER — Ambulatory Visit (INDEPENDENT_AMBULATORY_CARE_PROVIDER_SITE_OTHER): Payer: BC Managed Care – PPO | Admitting: Family Medicine

## 2022-05-13 VITALS — BP 110/76 | HR 93 | Temp 98.6°F | Ht 62.0 in | Wt 235.0 lb

## 2022-05-13 DIAGNOSIS — E559 Vitamin D deficiency, unspecified: Secondary | ICD-10-CM | POA: Diagnosis not present

## 2022-05-13 DIAGNOSIS — E88819 Insulin resistance, unspecified: Secondary | ICD-10-CM

## 2022-05-13 DIAGNOSIS — Z6841 Body Mass Index (BMI) 40.0 and over, adult: Secondary | ICD-10-CM

## 2022-05-13 DIAGNOSIS — E669 Obesity, unspecified: Secondary | ICD-10-CM

## 2022-05-13 MED ORDER — VITAMIN D (ERGOCALCIFEROL) 1.25 MG (50000 UNIT) PO CAPS: ORAL_CAPSULE | ORAL | 0 refills | Status: DC

## 2022-05-13 MED ORDER — METFORMIN HCL 500 MG PO TABS: 500.0000 mg | ORAL_TABLET | Freq: Every day | ORAL | 0 refills | Status: DC

## 2022-05-26 NOTE — Progress Notes (Signed)
Chief Complaint:   OBESITY Catherine Lane is here to discuss her progress with her obesity treatment plan along with follow-up of her obesity related diagnoses. Catherine Lane is on the Category 4 Plan and states she is following her eating plan approximately 40-50% of the time. Catherine Lane states she is walking treadmill at gym 30 minutes 3 times per week.  Today's visit was #: 10 Starting weight: 234 lbs Starting date: 09/30/2021 Today's weight: 235 lbs Today's date: 05/13/2022 Total lbs lost to date: 0 lbs Total lbs lost since last in-office visit: 3  Interim History:Catherine Lane has been living life as normal since last appointment.  Went to her family for Thanksgiving gathering.  Not too much plan for the next week and a half.  Has not been following plan as strictly due to feeling less motivated.  Food is vegetables are hard to get in.  Wondering if she could incorporate Amazing Grass Green Blend (30 cal but all fruits, veg and probiotic). .  Subjective:   1. Vitamin D deficiency Catherine Lane is currently taking prescription Vit D 50,000 IU once a week. Denies any side effects. She notes fatigue.  2. Insulin resistance Star is on Metformin. Denies GI side effects. Last labs done in Sept.  Assessment/Plan:   1. Vitamin D deficiency We will refill Vit D 50K IU once weekly for 1 month with 0 refills.  -Refill Vitamin D, Ergocalciferol, (DRISDOL) 1.25 MG (50000 UNIT) CAPS capsule; One cap twice weekly  Dispense: 8 capsule; Refill: 0  2. Insulin resistance We will refill Metformin 500 mg daily for 1 month with 0 refills. Will obtain labs at next visit.  -Refill  metFORMIN (GLUCOPHAGE) 500 MG tablet; Take 1 tablet (500 mg total) by mouth daily with breakfast.  Dispense: 30 tablet; Refill: 0  3. Obesity, current BMI 43.1 Catherine Lane is currently in the action stage of change. As such, her goal is to continue with weight loss efforts. She has agreed to the Category 4 Plan.   Exercise goals: All adults should avoid  inactivity. Some physical activity is better than none, and adults who participate in any amount of physical activity gain some health benefits.  Behavioral modification strategies: increasing lean protein intake, meal planning and cooking strategies, keeping healthy foods in the home, and planning for success.  Catherine Lane has agreed to follow-up with our clinic in 4 weeks. She was informed of the importance of frequent follow-up visits to maximize her success with intensive lifestyle modifications for her multiple health conditions.   Objective:   Blood pressure 110/76, pulse 93, temperature 98.6 F (37 C), height 5\' 2"  (1.575 m), weight 235 lb (106.6 kg), SpO2 98 %. Body mass index is 42.98 kg/m.  General: Cooperative, alert, well developed, in no acute distress. HEENT: Conjunctivae and lids unremarkable. Cardiovascular: Regular rhythm.  Lungs: Normal work of breathing. Neurologic: No focal deficits.   Lab Results  Component Value Date   CREATININE 1.05 (H) 02/25/2022   BUN 8 02/25/2022   NA 138 02/25/2022   K 4.6 02/25/2022   CL 102 02/25/2022   CO2 20 02/25/2022   Lab Results  Component Value Date   ALT 41 (H) 02/25/2022   AST 30 02/25/2022   ALKPHOS 69 02/25/2022   BILITOT 0.7 02/25/2022   Lab Results  Component Value Date   HGBA1C 5.4 02/25/2022   HGBA1C 5.4 09/30/2021   HGBA1C 5.0 09/25/2019   HGBA1C 4.9 06/21/2016   HGBA1C 5.5 04/30/2014   Lab Results  Component Value  Date   INSULIN 37.6 (H) 02/25/2022   INSULIN 25.3 (H) 09/30/2021   Lab Results  Component Value Date   TSH 1.170 09/30/2021   Lab Results  Component Value Date   CHOL 171 02/25/2022   HDL 41 02/25/2022   LDLCALC 109 (H) 02/25/2022   TRIG 118 02/25/2022   CHOLHDL 4.2 02/25/2022   Lab Results  Component Value Date   VD25OH 20.3 (L) 02/25/2022   VD25OH 16.7 (L) 09/30/2021   VD25OH 12 (L) 06/21/2016   Lab Results  Component Value Date   WBC 9.9 04/01/2020   HGB 14.2 04/01/2020   HCT  42.2 04/01/2020   MCV 82.7 04/01/2020   PLT 473 (H) 04/01/2020   No results found for: "IRON", "TIBC", "FERRITIN"  Attestation Statements:   Reviewed by clinician on day of visit: allergies, medications, problem list, medical history, surgical history, family history, social history, and previous encounter notes.  I, Fortino Sic, RMA am acting as transcriptionist for Reuben Likes, MD. I have reviewed the above documentation for accuracy and completeness, and I agree with the above. - Reuben Likes, MD

## 2022-06-08 DIAGNOSIS — R591 Generalized enlarged lymph nodes: Secondary | ICD-10-CM | POA: Diagnosis not present

## 2022-06-08 DIAGNOSIS — M26621 Arthralgia of right temporomandibular joint: Secondary | ICD-10-CM | POA: Diagnosis not present

## 2022-06-10 ENCOUNTER — Ambulatory Visit (INDEPENDENT_AMBULATORY_CARE_PROVIDER_SITE_OTHER): Payer: BC Managed Care – PPO | Admitting: Adult Health

## 2022-06-10 ENCOUNTER — Encounter (INDEPENDENT_AMBULATORY_CARE_PROVIDER_SITE_OTHER): Payer: Self-pay

## 2022-06-15 ENCOUNTER — Ambulatory Visit (INDEPENDENT_AMBULATORY_CARE_PROVIDER_SITE_OTHER): Payer: BC Managed Care – PPO | Admitting: Adult Health

## 2022-06-15 ENCOUNTER — Encounter (INDEPENDENT_AMBULATORY_CARE_PROVIDER_SITE_OTHER): Payer: Self-pay

## 2022-06-15 DIAGNOSIS — Z113 Encounter for screening for infections with a predominantly sexual mode of transmission: Secondary | ICD-10-CM | POA: Diagnosis not present

## 2022-06-15 DIAGNOSIS — N760 Acute vaginitis: Secondary | ICD-10-CM | POA: Diagnosis not present

## 2022-06-15 DIAGNOSIS — R319 Hematuria, unspecified: Secondary | ICD-10-CM | POA: Diagnosis not present

## 2022-06-15 DIAGNOSIS — Z01419 Encounter for gynecological examination (general) (routine) without abnormal findings: Secondary | ICD-10-CM | POA: Diagnosis not present

## 2022-06-15 DIAGNOSIS — Z1159 Encounter for screening for other viral diseases: Secondary | ICD-10-CM | POA: Diagnosis not present

## 2022-06-15 DIAGNOSIS — Z6841 Body Mass Index (BMI) 40.0 and over, adult: Secondary | ICD-10-CM | POA: Diagnosis not present

## 2022-06-29 DIAGNOSIS — L02426 Furuncle of left lower limb: Secondary | ICD-10-CM | POA: Diagnosis not present

## 2022-06-29 DIAGNOSIS — L819 Disorder of pigmentation, unspecified: Secondary | ICD-10-CM | POA: Diagnosis not present

## 2022-06-29 DIAGNOSIS — D225 Melanocytic nevi of trunk: Secondary | ICD-10-CM | POA: Diagnosis not present

## 2022-06-29 DIAGNOSIS — L02425 Furuncle of right lower limb: Secondary | ICD-10-CM | POA: Diagnosis not present

## 2022-07-12 DIAGNOSIS — F331 Major depressive disorder, recurrent, moderate: Secondary | ICD-10-CM | POA: Diagnosis not present

## 2022-07-12 DIAGNOSIS — F900 Attention-deficit hyperactivity disorder, predominantly inattentive type: Secondary | ICD-10-CM | POA: Diagnosis not present

## 2022-07-12 DIAGNOSIS — F411 Generalized anxiety disorder: Secondary | ICD-10-CM | POA: Diagnosis not present

## 2022-07-19 NOTE — Progress Notes (Unsigned)
HPI: Catherine Lane is a 27 y.o. female, who is here today for her routine physical.  Last CPE: unsure  Regular exercise 3 or more time per week: *** Following a healthy diet: ***  Chronic medical problems: ***  Immunization History  Administered Date(s) Administered  . Influenza,inj,Quad PF,6+ Mos 04/30/2014, 02/19/2015, 02/19/2015, 04/29/2016, 04/01/2020  . PFIZER(Purple Top)SARS-COV-2 Vaccination 08/13/2019, 09/04/2019  . Tdap 04/29/2016   Health Maintenance  Topic Date Due  . HPV VACCINES (1 - 2-dose series) Never done  . PAP-Cervical Cytology Screening  Never done  . PAP SMEAR-Modifier  Never done  . INFLUENZA VACCINE  12/29/2021  . COVID-19 Vaccine (3 - 2023-24 season) 01/29/2022  . DTaP/Tdap/Td (2 - Td or Tdap) 04/29/2026  . Hepatitis C Screening  Completed  . HIV Screening  Completed    She has *** concerns today.  Review of Systems  Current Outpatient Medications on File Prior to Visit  Medication Sig Dispense Refill  . albuterol (PROVENTIL) (5 MG/ML) 0.5% nebulizer solution Take 0.5 mLs (2.5 mg total) by nebulization every 6 (six) hours as needed for wheezing or shortness of breath. 20 mL 2  . Biotin w/ Vitamins C & E (HAIR/SKIN/NAILS PO) Take by mouth.    Marland Kitchen FLUoxetine (PROZAC) 40 MG capsule Take 40 mg by mouth daily.    Marland Kitchen lisdexamfetamine (VYVANSE) 30 MG capsule Take 30 mg by mouth daily.    . metFORMIN (GLUCOPHAGE) 500 MG tablet Take 1 tablet (500 mg total) by mouth daily with breakfast. 30 tablet 0  . Multiple Vitamins-Minerals (MULTI-VITAMIN GUMMIES PO) Take by mouth.    . Nebulizer MISC 1 nebulizer machine for home use 1 each 0  . valACYclovir (VALTREX) 500 MG tablet TAKE 1 TABLET BY MOUTH EVERY 12 HOURS FOR 3 DAYS AS NEEDED FOR FLARE UPS ASAP 30 tablet 0  . VENTOLIN HFA 108 (90 Base) MCG/ACT inhaler INHALE 2 PUFFS BY MOUTH EVERY 4 HOURS AS NEEDED FOR WHEEZING OR SHORTNESS OF BREATH 18 g 1  . Vitamin D, Ergocalciferol, (DRISDOL) 1.25 MG (50000  UNIT) CAPS capsule One cap twice weekly 8 capsule 0   No current facility-administered medications on file prior to visit.    Past Medical History:  Diagnosis Date  . ADHD   . Alcohol abuse   . Anemia   . Anxiety   . Asthma   . Constipation   . Depression   . Genital herpes   . GERD (gastroesophageal reflux disease)   . Infertility, female   . Multiple food allergies   . OCD (obsessive compulsive disorder)   . PCOS (polycystic ovarian syndrome)   . Vitamin D deficiency     No past surgical history on file.  Allergies  Allergen Reactions  . Corylus Anaphylaxis    *** Hazelnuts  *** Hazelnuts      Family History  Problem Relation Age of Onset  . Obesity Mother   . Asthma Father   . Hyperlipidemia Father   . Hypertension Father   . Diabetes Father   . Sleep apnea Father   . Obesity Father   . Hyperlipidemia Paternal Grandmother   . Hyperlipidemia Paternal Aunt   . Cancer Paternal Uncle   . Diabetes Paternal Uncle     Social History   Socioeconomic History  . Marital status: Significant Other    Spouse name: Not on file  . Number of children: Not on file  . Years of education: Not on file  . Highest education level:  Some college, no degree  Occupational History  . Occupation: Animal nutritionist rep  Tobacco Use  . Smoking status: Never  . Smokeless tobacco: Never  Substance and Sexual Activity  . Alcohol use: No    Alcohol/week: 0.0 standard drinks of alcohol  . Drug use: No  . Sexual activity: Never    Birth control/protection: None  Other Topics Concern  . Not on file  Social History Narrative  . Not on file   Social Determinants of Health   Financial Resource Strain: Medium Risk (09/07/2021)   Overall Financial Resource Strain (CARDIA)   . Difficulty of Paying Living Expenses: Somewhat hard  Food Insecurity: Food Insecurity Present (09/07/2021)   Hunger Vital Sign   . Worried About Charity fundraiser in the Last Year: Never true   . Ran Out of  Food in the Last Year: Sometimes true  Transportation Needs: No Transportation Needs (09/07/2021)   PRAPARE - Transportation   . Lack of Transportation (Medical): No   . Lack of Transportation (Non-Medical): No  Physical Activity: Sufficiently Active (09/07/2021)   Exercise Vital Sign   . Days of Exercise per Week: 3 days   . Minutes of Exercise per Session: 60 min  Stress: No Stress Concern Present (09/07/2021)   [place]   . Feeling of Stress : Only a little  Social Connections: Moderately Isolated (09/07/2021)   Social Connection and Isolation Panel [NHANES]   . Frequency of Communication with Friends and Family: More than three times a week   . Frequency of Social Gatherings with Friends and Family: Once a week   . Attends Religious Services: 1 to 4 times per year   . Active Member of Clubs or Organizations: No   . Attends Archivist Meetings: Not on file   . Marital Status: Never married    There were no vitals filed for this visit. There is no height or weight on file to calculate BMI.  Wt Readings from Last 3 Encounters:  05/13/22 235 lb (106.6 kg)  04/19/22 238 lb (108 kg)  03/18/22 235 lb (106.6 kg)    Physical Exam Vitals and nursing note reviewed.  Constitutional:      General: She is not in acute distress.    Appearance: She is well-developed.  HENT:     Head: Normocephalic and atraumatic.     Right Ear: Hearing, tympanic membrane, ear canal and external ear normal.     Left Ear: Hearing, tympanic membrane, ear canal and external ear normal.     Mouth/Throat:     Mouth: Mucous membranes are moist.     Pharynx: Oropharynx is clear. Uvula midline.  Eyes:     Extraocular Movements: Extraocular movements intact.     Conjunctiva/sclera: Conjunctivae normal.     Pupils: Pupils are equal, round, and reactive to light.  Neck:     Thyroid: No thyromegaly.     Trachea: No tracheal deviation.   Cardiovascular:     Rate and Rhythm: Normal rate and regular rhythm.     Pulses:          Dorsalis pedis pulses are 2+ on the right side and 2+ on the left side.       Posterior tibial pulses are 2+ on the right side and 2+ on the left side.     Heart sounds: No murmur heard. Pulmonary:     Effort: Pulmonary effort is normal. No respiratory distress.  Breath sounds: Normal breath sounds.  Abdominal:     Palpations: Abdomen is soft. There is no hepatomegaly or mass.     Tenderness: There is no abdominal tenderness.  Genitourinary:    Comments: Deferred to gyn. Musculoskeletal:     Comments: No major deformity or signs of synovitis appreciated.  Lymphadenopathy:     Cervical: No cervical adenopathy.     Upper Body:     Right upper body: No supraclavicular adenopathy.     Left upper body: No supraclavicular adenopathy.  Skin:    General: Skin is warm.     Findings: No erythema or rash.  Neurological:     General: No focal deficit present.     Mental Status: She is alert and oriented to person, place, and time.     Cranial Nerves: No cranial nerve deficit.     Coordination: Coordination normal.     Gait: Gait normal.     Deep Tendon Reflexes:     Reflex Scores:      Bicep reflexes are 2+ on the right side and 2+ on the left side.      Patellar reflexes are 2+ on the right side and 2+ on the left side. Psychiatric:     Comments: Well groomed, good eye contact.   ASSESSMENT AND PLAN: Catherine Lane was here today annual physical examination.  No orders of the defined types were placed in this encounter.   There are no diagnoses linked to this encounter.  There are no diagnoses linked to this encounter.  No follow-ups on file.  Kerin Kren G. Martinique, MD  [facility]. [place] office.

## 2022-07-20 ENCOUNTER — Encounter: Payer: Self-pay | Admitting: Family Medicine

## 2022-07-20 ENCOUNTER — Ambulatory Visit (INDEPENDENT_AMBULATORY_CARE_PROVIDER_SITE_OTHER): Payer: BC Managed Care – PPO | Admitting: Family Medicine

## 2022-07-20 VITALS — BP 118/80 | HR 96 | Temp 98.2°F | Resp 12 | Ht 62.0 in | Wt 242.1 lb

## 2022-07-20 DIAGNOSIS — Z0000 Encounter for general adult medical examination without abnormal findings: Secondary | ICD-10-CM | POA: Diagnosis not present

## 2022-07-20 DIAGNOSIS — R7401 Elevation of levels of liver transaminase levels: Secondary | ICD-10-CM | POA: Diagnosis not present

## 2022-07-20 DIAGNOSIS — Z6841 Body Mass Index (BMI) 40.0 and over, adult: Secondary | ICD-10-CM

## 2022-07-20 DIAGNOSIS — E559 Vitamin D deficiency, unspecified: Secondary | ICD-10-CM

## 2022-07-20 DIAGNOSIS — J4520 Mild intermittent asthma, uncomplicated: Secondary | ICD-10-CM

## 2022-07-20 LAB — HEPATIC FUNCTION PANEL
ALT: 39 U/L — ABNORMAL HIGH (ref 0–35)
AST: 29 U/L (ref 0–37)
Albumin: 4.2 g/dL (ref 3.5–5.2)
Alkaline Phosphatase: 61 U/L (ref 39–117)
Bilirubin, Direct: 0.1 mg/dL (ref 0.0–0.3)
Total Bilirubin: 0.4 mg/dL (ref 0.2–1.2)
Total Protein: 7.9 g/dL (ref 6.0–8.3)

## 2022-07-20 LAB — VITAMIN D 25 HYDROXY (VIT D DEFICIENCY, FRACTURES): VITD: 21.11 ng/mL — ABNORMAL LOW (ref 30.00–100.00)

## 2022-07-20 LAB — BASIC METABOLIC PANEL
BUN: 9 mg/dL (ref 6–23)
CO2: 24 mEq/L (ref 19–32)
Calcium: 9.7 mg/dL (ref 8.4–10.5)
Chloride: 104 mEq/L (ref 96–112)
Creatinine, Ser: 0.89 mg/dL (ref 0.40–1.20)
GFR: 89.51 mL/min (ref >60.00)
Glucose, Bld: 106 mg/dL — ABNORMAL HIGH (ref 70–99)
Potassium: 4 mEq/L (ref 3.5–5.1)
Sodium: 137 mEq/L (ref 135–145)

## 2022-07-20 MED ORDER — VENTOLIN HFA 108 (90 BASE) MCG/ACT IN AERS: 1.0000 | INHALATION_SPRAY | RESPIRATORY_TRACT | 1 refills | Status: DC | PRN

## 2022-07-20 NOTE — Patient Instructions (Addendum)
A few things to remember from today's visit:  Routine general medical examination at a health care facility  If you need refills for medications you take chronically, please call your pharmacy. Do not use My Chart to request refills or for acute issues that need immediate attention. If you send a my chart message, it may take a few days to be addressed, specially if I am not in the office.  Please be sure medication list is accurate. If a new problem present, please set up appointment sooner than planned today.  Health Maintenance, Female Adopting a healthy lifestyle and getting preventive care are important in promoting health and wellness. Ask your health care provider about: The right schedule for you to have regular tests and exams. Things you can do on your own to prevent diseases and keep yourself healthy. What should I know about diet, weight, and exercise? Eat a healthy diet  Eat a diet that includes plenty of vegetables, fruits, low-fat dairy products, and lean protein. Do not eat a lot of foods that are high in solid fats, added sugars, or sodium. Maintain a healthy weight Body mass index (BMI) is used to identify weight problems. It estimates body fat based on height and weight. Your health care provider can help determine your BMI and help you achieve or maintain a healthy weight. Get regular exercise Get regular exercise. This is one of the most important things you can do for your health. Most adults should: Exercise for at least 150 minutes each week. The exercise should increase your heart rate and make you sweat (moderate-intensity exercise). Do strengthening exercises at least twice a week. This is in addition to the moderate-intensity exercise. Spend less time sitting. Even light physical activity can be beneficial. Watch cholesterol and blood lipids Have your blood tested for lipids and cholesterol at 27 years of age, then have this test every 5 years. Have your  cholesterol levels checked more often if: Your lipid or cholesterol levels are high. You are older than 27 years of age. You are at high risk for heart disease. What should I know about cancer screening? Depending on your health history and family history, you may need to have cancer screening at various ages. This may include screening for: Breast cancer. Cervical cancer. Colorectal cancer. Skin cancer. Lung cancer. What should I know about heart disease, diabetes, and high blood pressure? Blood pressure and heart disease High blood pressure causes heart disease and increases the risk of stroke. This is more likely to develop in people who have high blood pressure readings or are overweight. Have your blood pressure checked: Every 3-5 years if you are 55-61 years of age. Every year if you are 6 years old or older. Diabetes Have regular diabetes screenings. This checks your fasting blood sugar level. Have the screening done: Once every three years after age 11 if you are at a normal weight and have a low risk for diabetes. More often and at a younger age if you are overweight or have a high risk for diabetes. What should I know about preventing infection? Hepatitis B If you have a higher risk for hepatitis B, you should be screened for this virus. Talk with your health care provider to find out if you are at risk for hepatitis B infection. Hepatitis C Testing is recommended for: Everyone born from [birth year] through 1965. Anyone with known risk factors for hepatitis C. Sexually transmitted infections (STIs) Get screened for STIs, including gonorrhea and chlamydia, if:  You are sexually active and are younger than 27 years of age. You are older than 27 years of age and your health care provider tells you that you are at risk for this type of infection. Your sexual activity has changed since you were last screened, and you are at increased risk for chlamydia or gonorrhea. Ask your health care  provider if you are at risk. Ask your health care provider about whether you are at high risk for HIV. Your health care provider may recommend a prescription medicine to help prevent HIV infection. If you choose to take medicine to prevent HIV, you should first get tested for HIV. You should then be tested every 3 months for as long as you are taking the medicine. Pregnancy If you are about to stop having your period (premenopausal) and you may become pregnant, seek counseling before you get pregnant. Take 400 to 800 micrograms (mcg) of folic acid every day if you become pregnant. Ask for birth control (contraception) if you want to prevent pregnancy. Osteoporosis and menopause Osteoporosis is a disease in which the bones lose minerals and strength with aging. This can result in bone fractures. If you are 91 years old or older, or if you are at risk for osteoporosis and fractures, ask your health care provider if you should: Be screened for bone loss. Take a calcium or vitamin D supplement to lower your risk of fractures. Be given hormone replacement therapy (HRT) to treat symptoms of menopause. Follow these instructions at home: Alcohol use Do not drink alcohol if: Your health care provider tells you not to drink. You are pregnant, may be pregnant, or are planning to become pregnant. If you drink alcohol: Limit how much you have to: 0-1 drink a day. Know how much alcohol is in your drink. In the [place] one drink equals one 12 oz bottle of beer (355 mL), one 5 oz glass of wine (148 mL), or one 1 oz glass of hard liquor (44 mL). Lifestyle Do not use any products that contain nicotine or tobacco. These products include cigarettes, chewing tobacco, and vaping devices, such as e-cigarettes. If you need help quitting, ask your health care provider. Do not use street drugs. Do not share needles. Ask your health care provider for help if you need support or information about quitting drugs. General  instructions Schedule regular health, dental, and eye exams. Stay current with your vaccines. Tell your health care provider if: You often feel depressed. You have ever been abused or do not feel safe at home. Summary Adopting a healthy lifestyle and getting preventive care are important in promoting health and wellness. Follow your health care provider's instructions about healthy diet, exercising, and getting tested or screened for diseases. Follow your health care provider's instructions on monitoring your cholesterol and blood pressure. This information is not intended to replace advice given to you by your health care provider. Make sure you discuss any questions you have with your health care provider. Document Revised: 10/06/2020 Document Reviewed: 10/06/2020 Elsevier Patient Education  [place].

## 2022-07-20 NOTE — Assessment & Plan Note (Signed)
Mild. Further recommendation will be given according to LFT result.

## 2022-07-20 NOTE — Assessment & Plan Note (Signed)
Currently she is not on vitamin D supplementation, she completed treatment with ergocalciferol 50,000 units. Further recommendation will be given according to 25 [place] vitamin D result.

## 2022-07-20 NOTE — Assessment & Plan Note (Signed)
We discussed the importance of regular physical activity and healthy diet for prevention of chronic illness and/or complications. Preventive guidelines reviewed. Vaccination up-to-date. Continue female preventive care with her gynecologist. Next CPE in a year.

## 2022-07-20 NOTE — Assessment & Plan Note (Signed)
Problem is well-controlled. Continue albuterol inhaler 2 puff every 4-6 hours as needed. Follow-up in 1 year, before if needed.

## 2022-08-12 DIAGNOSIS — R319 Hematuria, unspecified: Secondary | ICD-10-CM | POA: Diagnosis not present

## 2022-08-17 DIAGNOSIS — R3121 Asymptomatic microscopic hematuria: Secondary | ICD-10-CM | POA: Diagnosis not present

## 2022-08-30 DIAGNOSIS — R3121 Asymptomatic microscopic hematuria: Secondary | ICD-10-CM | POA: Diagnosis not present

## 2022-09-08 DIAGNOSIS — R3121 Asymptomatic microscopic hematuria: Secondary | ICD-10-CM | POA: Diagnosis not present

## 2022-10-04 DIAGNOSIS — F411 Generalized anxiety disorder: Secondary | ICD-10-CM | POA: Diagnosis not present

## 2022-10-04 DIAGNOSIS — F900 Attention-deficit hyperactivity disorder, predominantly inattentive type: Secondary | ICD-10-CM | POA: Diagnosis not present

## 2022-10-04 DIAGNOSIS — F331 Major depressive disorder, recurrent, moderate: Secondary | ICD-10-CM | POA: Diagnosis not present

## 2022-11-01 DIAGNOSIS — F411 Generalized anxiety disorder: Secondary | ICD-10-CM | POA: Diagnosis not present

## 2022-11-01 DIAGNOSIS — F900 Attention-deficit hyperactivity disorder, predominantly inattentive type: Secondary | ICD-10-CM | POA: Diagnosis not present

## 2022-11-01 DIAGNOSIS — F331 Major depressive disorder, recurrent, moderate: Secondary | ICD-10-CM | POA: Diagnosis not present

## 2022-11-29 DIAGNOSIS — F900 Attention-deficit hyperactivity disorder, predominantly inattentive type: Secondary | ICD-10-CM | POA: Diagnosis not present

## 2022-11-29 DIAGNOSIS — F411 Generalized anxiety disorder: Secondary | ICD-10-CM | POA: Diagnosis not present

## 2022-11-29 DIAGNOSIS — F331 Major depressive disorder, recurrent, moderate: Secondary | ICD-10-CM | POA: Diagnosis not present
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
## 2023-01-13 DIAGNOSIS — Z3046 Encounter for surveillance of implantable subdermal contraceptive: Secondary | ICD-10-CM | POA: Diagnosis not present

## 2023-01-17 NOTE — Progress Notes (Deleted)
Date:  01/17/2023   HPI: Catherine Lane is a 27 y.o. female here for her 3 month PrEP follow up visit.  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 and expecting to have insurance coverage soon  Patient Active Problem List   Diagnosis Date Noted   Routine general medical examination at a health care facility 07/20/2022   Elevated ALT measurement 07/20/2022   Attention deficit disorder 10/26/2021   Vitamin D deficiency, unspecified 10/26/2021   Dyslipidemia 10/26/2021   Insulin resistance 10/05/2021   Low HDL (under 40) 10/05/2021   Recurrent genital herpes 09/25/2019   PCOS (polycystic ovarian syndrome) 09/25/2019   Class 3 severe obesity without serious comorbidity with body mass index (BMI) of 40.0 to 44.9 in adult, unspecified obesity type (HCC) 09/25/2019   Headache 02/10/2015   Asthma, mild intermittent 07/30/2014   Irregular periods 07/30/2014    Patient's Medications  New Prescriptions   NEBULIZER MISC    1 nebulizer machine for home use  Previous Medications   BIOTIN W/ VITAMINS C & E (HAIR/SKIN/NAILS PO)    Take by mouth.   FLUOXETINE (PROZAC) 20 MG CAPSULE    Take 40 mg by mouth daily.   MULTIPLE VITAMINS-MINERALS (MULTI-VITAMIN GUMMIES PO)    Take by mouth.  Modified Medications   Modified Medication Previous Medication   VALACYCLOVIR (VALTREX) 500 MG TABLET valACYclovir (VALTREX) 500 MG tablet      TAKE 1 TABLET BY MOUTH EVERY 12 HOURS FOR 3 DAYS AS NEEDED FOR FLARE UPS ASAP    1 tab every 12 hours for 3 days prn for flare ups,asap.   VENTOLIN HFA 108 (90 BASE) MCG/ACT INHALER VENTOLIN HFA 108 (90 Base) MCG/ACT inhaler      Inhale 1-2 puffs into the lungs every 4 (four) hours as needed for wheezing or shortness of breath.    INHALE 2 PUFFS BY MOUTH EVERY 4 HOURS AS NEEDED FOR WHEEZING OR SHORTNESS OF BREATH  Discontinued Medications   ALBUTEROL (PROVENTIL HFA;VENTOLIN HFA) 108  (90 BASE) MCG/ACT INHALER    Inhale 2 puffs into the lungs every 4 (four) hours as needed for wheezing or shortness of breath.   AMOXICILLIN (AMOXIL) 875 MG TABLET    Take 1 tablet (875 mg total) by mouth 2 (two) times daily.   EMTRICITABINE-TENOFOVIR (TRUVADA) 200-300 MG TABLET    Take 1 tablet by mouth daily.   IBUPROFEN (ADVIL,MOTRIN) 600 MG TABLET    Take 1 tablet (600 mg total) by mouth every 8 (eight) hours as needed (Take with food.).   NORELGESTROMIN-ETHINYL ESTRADIOL (ORTHO EVRA) 150-35 MCG/24HR TRANSDERMAL PATCH    Place 1 patch onto the skin once a week.   VALACYCLOVIR (VALTREX) 500 MG TABLET    Take 500 mg by mouth daily.    Allergies:   Past Medical History: Past Medical History:  Diagnosis Date   ADHD    Alcohol abuse    Anemia    Anxiety    Asthma    Constipation    Depression    Genital herpes    GERD (gastroesophageal reflux disease)    Infertility, female    Multiple food allergies    OCD (obsessive compulsive disorder)    PCOS (polycystic ovarian syndrome)    Vitamin D deficiency     Social History: Social History   Socioeconomic History   Marital status: Significant Other  Spouse name: Not on file   Number of children: Not on file   Years of education: Not on file   Highest education level: Some college, no degree  Occupational History   Occupation: Member Service rep  Tobacco Use   Smoking status: Never   Smokeless tobacco: Never  Substance and Sexual Activity   Alcohol use: No    Alcohol/week: 0.0 standard drinks of alcohol   Drug use: No   Sexual activity: Never    Birth control/protection: None  Other Topics Concern   Not on file  Social History Narrative   Not on file   Social Determinants of Health   Financial Resource Strain: Medium Risk (09/07/2021)   Overall Financial Resource Strain (CARDIA)    Difficulty of Paying Living Expenses: Somewhat hard  Food Insecurity: Food Insecurity Present (09/07/2021)   Hunger Vital Sign     Worried About Running Out of Food in the Last Year: Never true    Ran Out of Food in the Last Year: Sometimes true  Transportation Needs: No Transportation Needs (09/07/2021)   PRAPARE - Administrator, Civil Service (Medical): No    Lack of Transportation (Non-Medical): No  Physical Activity: Sufficiently Active (09/07/2021)   Exercise Vital Sign    Days of Exercise per Week: 3 days    Minutes of Exercise per Session: 60 min  Stress: No Stress Concern Present (09/07/2021)   [employer] of Occupational Health - Occupational Stress Questionnaire    Feeling of Stress : Only a little  Social Connections: Moderately Isolated (09/07/2021)   Social Connection and Isolation Panel [NHANES]    Frequency of Communication with Friends and Family: More than three times a week    Frequency of Social Gatherings with Friends and Family: Once a week    Attends Religious Services: 1 to 4 times per year    Active Member of [employer] or Organizations: No    Attends Banker Meetings: Not on file    Marital Status: Never married       05/02/2018    9:17 AM 05/02/2018    9:12 AM 01/31/2018    9:32 AM 01/31/2018    9:31 AM 11/02/2017    9:57 AM 10/06/2017    9:40 AM  CH[facility] HIV PREP FLOWSHEET RESULTS  Insurance Status Uninsured Uninsured Insured Uninsured Insured Insured  How did you hear?  referred by gyno  a referral from my doctor Friend, boyfriend  Friend, boyfriend is infect  Gender at birth Female Female Female Female Female Female  Gender identity cis-Female cis-Female cis-Female cis-Female cis-Female cis-Female  Risk for HIV In sexual relationship with HIV+ partner;Condomless vaginal or anal intercourse In sexual relationship with HIV+ partner;Condomless vaginal or anal intercourse;Hx of STI In sexual relationship with HIV+ partner;Condomless vaginal or anal intercourse In sexual relationship with HIV+ partner;Condomless vaginal or anal intercourse In sexual relationship with HIV+  partner;Condomless vaginal or anal intercourse In sexual relationship with HIV+ partner;Condomless vaginal or anal intercourse;Hx of STI  Sex Partners Men only Men only Men only Women only Men only Men only  # sex partners past 3-6 mos 1-3 1-3 1-3 1-3 1-3 1-3  Sex activity preferences Receptive;Oral Receptive;Oral Receptive;Oral Receptive;Oral Receptive;Oral Receptive;Oral  Condom use No No No No No Yes  % condom use     0 3  Partners genders and ages  M 20-24  M 20-24 M 20-24 M 20-24  Treated for STI? No No No No No Yes  HIV symptoms?  Sore throat Flu-like/Mono-like symptoms N/A N/A  N/A  PrEP Eligibility HIV negative;CrCl >60 ml/min;Substantial risk for HIV HIV negative;Past treatment for potential exposure to HIV from sex or injectable drugs HIV negative;Substantial risk for HIV;CrCl >60 ml/min HIV negative;Past treatment for potential exposure to HIV from sex or injectable drugs Substantial risk for HIV;CrCl >60 ml/min;HIV negative HIV negative;Substantial risk for HIV  Preg status No No No No No No  Breastfeeding? No No No No N/A N/A  Paper work received?  No  No  No    Labs:  SCr: Lab Results  Component Value Date   CREATININE 0.89 07/20/2022   CREATININE 1.05 (H) 02/25/2022   CREATININE 0.86 09/30/2021   CREATININE 0.82 04/01/2020   CREATININE 0.73 05/02/2018   HIV Lab Results  Component Value Date   HIV NON-REACTIVE 09/25/2019   HIV NON-REACTIVE 05/02/2018   HIV NON-REACTIVE 01/31/2018   HIV NON-REACTIVE 11/02/2017   HIV NON-REACTIVE 10/06/2017   Hepatitis B Lab Results  Component Value Date   HEPBSAB REACTIVE (A) 10/06/2017   HEPBSAG NON-REACTIVE 10/06/2017   Hepatitis C Lab Results  Component Value Date   HEPCAB NON-REACTIVE 10/06/2017   Hepatitis A Lab Results  Component Value Date   HAV REACTIVE (A) 10/06/2017   RPR and STI Lab Results  Component Value Date   LABRPR NON-REACTIVE 09/25/2019   LABRPR NON-REACTIVE 10/06/2017    STI Results GC CT   08/05/2020  4:27 PM Negative  Negative   09/25/2019 10:29 AM Negative  Negative   11/28/2018 12:00 AM Negative  Negative   10/06/2017 12:00 AM Negative    Negative    Negative  Negative    Negative    Negative     Assessment: Catherine Lane is here for her 3 month PrEP follow up visit. She reports that she has not taken Truvada for ~2 weeks because she ran out of refills. However, the last prescription was sent for #30 with 2 refills on 11/03/17. She has continued to have sex with her HIV+ partner during this time without condoms. Her partner is on ART and she confirms that he takes his medication every day. She denies any s/sx of acute HIV infection.  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 She has now been hired on at [employer] and is expecting to have new insurance coverage soon. In the meantime, she has been using [place] patient assistance. She reports making $15.75/hr and works 30-40 hours per week. Income verification letter written and signed during this visit.  Plan: Pregnancy test HIV Ab 3 months Truvada if negative Next 3 month f/u on 05/02/18 @ 0915  Erin N. Zigmund Daniel, PharmD PGY2 Infectious Diseases Pharmacy Resident Phone: [phone] 01/17/2023, 9:30 AM

## 2023-01-24 DIAGNOSIS — F331 Major depressive disorder, recurrent, moderate: Secondary | ICD-10-CM | POA: Diagnosis not present

## 2023-01-24 DIAGNOSIS — F900 Attention-deficit hyperactivity disorder, predominantly inattentive type: Secondary | ICD-10-CM | POA: Diagnosis not present

## 2023-01-24 DIAGNOSIS — F411 Generalized anxiety disorder: Secondary | ICD-10-CM | POA: Diagnosis not present

## 2023-03-17 DIAGNOSIS — R3121 Asymptomatic microscopic hematuria: Secondary | ICD-10-CM | POA: Diagnosis not present

## 2023-04-18 DIAGNOSIS — F331 Major depressive disorder, recurrent, moderate: Secondary | ICD-10-CM | POA: Diagnosis not present

## 2023-04-18 DIAGNOSIS — F411 Generalized anxiety disorder: Secondary | ICD-10-CM | POA: Diagnosis not present

## 2023-06-21 DIAGNOSIS — Z01419 Encounter for gynecological examination (general) (routine) without abnormal findings: Secondary | ICD-10-CM | POA: Diagnosis not present

## 2023-06-21 DIAGNOSIS — Z130 Encounter for screening for diseases of the blood and blood-forming organs and certain disorders involving the immune mechanism: Secondary | ICD-10-CM | POA: Diagnosis not present

## 2023-06-21 DIAGNOSIS — Z113 Encounter for screening for infections with a predominantly sexual mode of transmission: Secondary | ICD-10-CM | POA: Diagnosis not present

## 2023-06-21 DIAGNOSIS — Z6826 Body mass index (BMI) 26.0-26.9, adult: Secondary | ICD-10-CM | POA: Diagnosis not present

## 2023-06-28 DIAGNOSIS — L218 Other seborrheic dermatitis: Secondary | ICD-10-CM | POA: Diagnosis not present

## 2023-06-28 DIAGNOSIS — L700 Acne vulgaris: Secondary | ICD-10-CM | POA: Diagnosis not present

## 2023-06-28 DIAGNOSIS — L819 Disorder of pigmentation, unspecified: Secondary | ICD-10-CM | POA: Diagnosis not present

## 2023-07-12 DIAGNOSIS — F411 Generalized anxiety disorder: Secondary | ICD-10-CM | POA: Diagnosis not present

## 2023-07-12 DIAGNOSIS — F331 Major depressive disorder, recurrent, moderate: Secondary | ICD-10-CM | POA: Diagnosis not present

## 2023-07-12 DIAGNOSIS — F900 Attention-deficit hyperactivity disorder, predominantly inattentive type: Secondary | ICD-10-CM | POA: Diagnosis not present

## 2023-08-08 NOTE — Telephone Encounter (Signed)
 N/a sch error

## 2023-09-07 ENCOUNTER — Encounter: Payer: Self-pay | Admitting: Family Medicine

## 2023-09-07 ENCOUNTER — Ambulatory Visit (INDEPENDENT_AMBULATORY_CARE_PROVIDER_SITE_OTHER): Admitting: Family Medicine

## 2023-09-07 VITALS — BP 130/100 | HR 94 | Temp 98.6°F | Resp 12 | Ht 62.0 in | Wt 250.0 lb

## 2023-09-07 DIAGNOSIS — R7401 Elevation of levels of liver transaminase levels: Secondary | ICD-10-CM | POA: Diagnosis not present

## 2023-09-07 DIAGNOSIS — E785 Hyperlipidemia, unspecified: Secondary | ICD-10-CM | POA: Diagnosis not present

## 2023-09-07 DIAGNOSIS — R7309 Other abnormal glucose: Secondary | ICD-10-CM | POA: Diagnosis not present

## 2023-09-07 DIAGNOSIS — Z0000 Encounter for general adult medical examination without abnormal findings: Secondary | ICD-10-CM

## 2023-09-07 DIAGNOSIS — E559 Vitamin D deficiency, unspecified: Secondary | ICD-10-CM

## 2023-09-07 DIAGNOSIS — R030 Elevated blood-pressure reading, without diagnosis of hypertension: Secondary | ICD-10-CM | POA: Insufficient documentation

## 2023-09-07 LAB — LIPID PANEL
Cholesterol: 200 mg/dL (ref 0–200)
HDL: 51.7 mg/dL (ref >39.00)
LDL Cholesterol: 124 mg/dL — ABNORMAL HIGH (ref 0–99)
NonHDL: 147.88
Total CHOL/HDL Ratio: 4
Triglycerides: 121 mg/dL (ref 0.0–149.0)
VLDL: 24.2 mg/dL (ref 0.0–40.0)

## 2023-09-07 LAB — BASIC METABOLIC PANEL WITH GFR
BUN: 8 mg/dL (ref 6–23)
CO2: 27 meq/L (ref 19–32)
Calcium: 8.8 mg/dL (ref 8.4–10.5)
Chloride: 102 meq/L (ref 96–112)
Creatinine, Ser: 0.84 mg/dL (ref 0.40–1.20)
GFR: 95.18 mL/min (ref >60.00)
Glucose, Bld: 89 mg/dL (ref 70–99)
Potassium: 4 meq/L (ref 3.5–5.1)
Sodium: 136 meq/L (ref 135–145)

## 2023-09-07 LAB — VITAMIN D 25 HYDROXY (VIT D DEFICIENCY, FRACTURES): VITD: 22.28 ng/mL — ABNORMAL LOW (ref 30.00–100.00)

## 2023-09-07 LAB — HEPATIC FUNCTION PANEL
ALT: 41 U/L — ABNORMAL HIGH (ref 0–35)
AST: 30 U/L (ref 0–37)
Albumin: 4.2 g/dL (ref 3.5–5.2)
Alkaline Phosphatase: 71 U/L (ref 39–117)
Bilirubin, Direct: 0.1 mg/dL (ref 0.0–0.3)
Total Bilirubin: 0.3 mg/dL (ref 0.2–1.2)
Total Protein: 7.6 g/dL (ref 6.0–8.3)

## 2023-09-07 LAB — HEMOGLOBIN A1C: Hgb A1c MFr Bld: 5.3 % (ref 4.6–6.5)

## 2023-09-07 NOTE — Assessment & Plan Note (Signed)
 Problem has been stable. ? Hepatic steatosis. Wt loss, increase vegetables intake, and decrease carb intake are recommended.

## 2023-09-07 NOTE — Assessment & Plan Note (Signed)
 Continue OTC Vit D 2000 U daily. Further recommendations according to lab results.

## 2023-09-07 NOTE — Progress Notes (Signed)
 HPI: Ms.Catherine Lane is a 28 y.o. female with a PMHx significant for PCOS, asthma, insulin resistance, dyslipidemia, vitamin D deficiency, depression/anxiety, and ADHD, who is here today for her routine physical.  Last CPE: 07/20/2022  Exercise: Patient states she goes to the gym 3-4 days per week to lift weights and walk. Diet: She is mostly eating out. She eats vegetables daily.  Sleep: She has occasional trouble sleeping so some nights she only gets 4-5 hours and others she gets as many as 8. Alcohol Use: A couple drinks on weekends.  Smoking: never Vision: She wear eye glasses, UTD on routine vision care.  Dental: UTD on routine dental care.   She follows regularly with gynecology.   Immunization History  Administered Date(s) Administered   Influenza,inj,Quad PF,6+ Mos 04/30/2014, 02/19/2015, 02/19/2015, 04/29/2016, 04/01/2020   PFIZER(Purple Top)SARS-COV-2 Vaccination 08/13/2019, 09/04/2019   Tdap 04/29/2016   Health Maintenance  Topic Date Due   Pneumococcal Vaccine 38-34 Years old (1 of 2 - PCV) Never done   COVID-19 Vaccine (3 - 2024-25 season) 01/30/2023   INFLUENZA VACCINE  12/30/2023   Cervical Cancer Screening (Pap smear)  06/10/2024   DTaP/Tdap/Td (2 - Td or Tdap) 04/29/2026   Hepatitis C Screening  Completed   HIV Screening  Completed   HPV VACCINES  Aged Out   Chronic medical problems:   Anxiety/Depression:  Currently on Fluoxetine 40 mg daily.  She sees her psychiatrist every 3 months.   Vitamin D deficiency:  Currently on vitamin D 2000 units daily.   Elevated BP:  Her blood pressure in the office is 126/100.  She doesn't check her BP regularly at home.  No hx of HTN.  Patient had seen weight loss clinic but hasn't gone for awhile because the appointments were not working well with her schedule.    Review of Systems  Constitutional:  Negative for activity change, appetite change and fever.  HENT:  Negative for mouth sores, sore throat and  trouble swallowing.   Eyes:  Negative for redness and visual disturbance.  Respiratory:  Negative for cough, shortness of breath and wheezing.   Cardiovascular:  Negative for chest pain and leg swelling.  Gastrointestinal:  Negative for abdominal pain, nausea and vomiting.       No changes in bowel habits.  Endocrine: Negative for cold intolerance, heat intolerance, polydipsia, polyphagia and polyuria.  Genitourinary:  Negative for decreased urine volume, dysuria and hematuria.  Musculoskeletal:  Negative for gait problem and myalgias.  Skin:  Negative for color change and rash.  Allergic/Immunologic: Positive for environmental allergies.  Neurological:  Negative for syncope, weakness and headaches.  Hematological:  Negative for adenopathy. Does not bruise/bleed easily.  Psychiatric/Behavioral:  Negative for confusion and hallucinations.   All other systems reviewed and are negative.  Current Outpatient Medications on File Prior to Visit  Medication Sig Dispense Refill   Biotin w/ Vitamins C & E (HAIR/SKIN/NAILS PO) Take by mouth.     FLUoxetine (PROZAC) 20 MG capsule Take 40 mg by mouth daily.     Multiple Vitamins-Minerals (MULTI-VITAMIN GUMMIES PO) Take by mouth.     Nebulizer MISC 1 nebulizer machine for home use 1 each 0   valACYclovir (VALTREX) 500 MG tablet TAKE 1 TABLET BY MOUTH EVERY 12 HOURS FOR 3 DAYS AS NEEDED FOR FLARE UPS ASAP 30 tablet 0   VENTOLIN HFA 108 (90 Base) MCG/ACT inhaler Inhale 1-2 puffs into the lungs every 4 (four) hours as needed for wheezing or shortness  of breath. 18 g 1   No current facility-administered medications on file prior to visit.   Past Medical History:  Diagnosis Date   ADHD    Alcohol abuse    Anemia    Anxiety    Asthma    Constipation    Depression    Genital herpes    GERD (gastroesophageal reflux disease)    Infertility, female    Multiple food allergies    OCD (obsessive compulsive disorder)    PCOS (polycystic ovarian  syndrome)    Vitamin D deficiency    No past surgical history on file.  Allergies  Allergen Reactions   Corylus Anaphylaxis     Hazelnuts   Hazelnuts     Family History  Problem Relation Age of Onset   Obesity Mother    Asthma Father    Hyperlipidemia Father    Hypertension Father    Diabetes Father    Sleep apnea Father    Obesity Father    Hyperlipidemia Paternal Grandmother    Hyperlipidemia Paternal Aunt    Cancer Paternal Uncle    Diabetes Paternal Uncle    Social History   Socioeconomic History   Marital status: Single    Spouse name: Not on file   Number of children: Not on file   Years of education: Not on file   Highest education level: Some college, no degree  Occupational History   Occupation: Animator rep  Tobacco Use   Smoking status: Never   Smokeless tobacco: Never  Substance and Sexual Activity   Alcohol use: No    Alcohol/week: 0.0 standard drinks of alcohol   Drug use: No   Sexual activity: Never    Birth control/protection: None  Other Topics Concern   Not on file  Social History Narrative   Not on file   Social Drivers of Health   Financial Resource Strain: Medium Risk (09/07/2021)   Overall Financial Resource Strain (CARDIA)    Difficulty of Paying Living Expenses: Somewhat hard  Food Insecurity: Food Insecurity Present (09/07/2021)   Hunger Vital Sign    Worried About Running Out of Food in the Last Year: Never true    Ran Out of Food in the Last Year: Sometimes true  Transportation Needs: No Transportation Needs (09/07/2021)   PRAPARE - Administrator, Civil Service (Medical): No    Lack of Transportation (Non-Medical): No  Physical Activity: Sufficiently Active (09/07/2021)   Exercise Vital Sign    Days of Exercise per Week: 3 days    Minutes of Exercise per Session: 60 min  Stress: No Stress Concern Present (09/07/2021)   [employer] of Occupational Health - Occupational Stress Questionnaire    Feeling of  Stress : Only a little  Social Connections: Moderately Isolated (09/07/2021)   Social Connection and Isolation Panel [NHANES]    Frequency of Communication with Friends and Family: More than three times a week    Frequency of Social Gatherings with Friends and Family: Once a week    Attends Religious Services: 1 to 4 times per year    Active Member of Clubs or Organizations: No    Attends Banker Meetings: Not on file    Marital Status: Never married   Today's Vitals   09/07/23 1054 09/07/23 1138  BP: (!) 126/100 (!) 130/100  Pulse: 94   Resp: 12   Temp: 98.6 F (37 C)   TempSrc: Oral   SpO2: 98%   Weight: 250  lb (113.4 kg)   Height: 5\' 2"  (1.575 m)    Body mass index is 45.73 kg/m.  Wt Readings from Last 3 Encounters:  09/07/23 250 lb (113.4 kg)  07/20/22 242 lb 2 oz (109.8 kg)  05/13/22 235 lb (106.6 kg)   Physical Exam Vitals and nursing note reviewed.  Constitutional:      General: She is not in acute distress.    Appearance: She is well-developed.  HENT:     Head: Normocephalic and atraumatic.     Right Ear: Tympanic membrane, ear canal and external ear normal.     Left Ear: Tympanic membrane, ear canal and external ear normal.     Mouth/Throat:     Mouth: Mucous membranes are moist.     Pharynx: Oropharynx is clear. Uvula midline.  Eyes:     Extraocular Movements: Extraocular movements intact.     Conjunctiva/sclera: Conjunctivae normal.     Pupils: Pupils are equal, round, and reactive to light.  Neck:     Thyroid: No thyroid mass or thyromegaly.  Cardiovascular:     Rate and Rhythm: Normal rate and regular rhythm.     Pulses:          Dorsalis pedis pulses are 2+ on the right side and 2+ on the left side.     Heart sounds: No murmur heard. Pulmonary:     Effort: Pulmonary effort is normal. No respiratory distress.     Breath sounds: Normal breath sounds.  Abdominal:     Palpations: Abdomen is soft. There is no hepatomegaly or mass.      Tenderness: There is no abdominal tenderness.  Genitourinary:    Comments: Deferred to gyn. Musculoskeletal:     Right lower leg: No edema.     Left lower leg: No edema.     Comments: No major deformity or signs of synovitis appreciated.  Lymphadenopathy:     Cervical: No cervical adenopathy.     Upper Body:     Right upper body: No supraclavicular adenopathy.     Left upper body: No supraclavicular adenopathy.  Skin:    General: Skin is warm.     Findings: No erythema or rash.  Neurological:     General: No focal deficit present.     Mental Status: She is alert and oriented to person, place, and time.     Cranial Nerves: No cranial nerve deficit.     Sensory: No sensory deficit.     Motor: No weakness.     Coordination: Coordination normal.     Gait: Gait normal.     Deep Tendon Reflexes:     Reflex Scores:      Bicep reflexes are 2+ on the right side and 2+ on the left side.      Patellar reflexes are 2+ on the right side and 2+ on the left side. Psychiatric:        Mood and Affect: Mood and affect normal.   ASSESSMENT AND PLAN:  Ms. Catherine Lane was here today for her annual physical examination.  Lab Results  Component Value Date   NA 136 09/07/2023   CL 102 09/07/2023   K 4.0 09/07/2023   CO2 27 09/07/2023   BUN 8 09/07/2023   CREATININE 0.84 09/07/2023   GFR 95.18 09/07/2023   CALCIUM 8.8 09/07/2023   ALBUMIN 4.2 09/07/2023   GLUCOSE 89 09/07/2023   Lab Results  Component Value Date   ALT 41 (H) 09/07/2023  AST 30 09/07/2023   ALKPHOS 71 09/07/2023   BILITOT 0.3 09/07/2023   Lab Results  Component Value Date   HGBA1C 5.3 09/07/2023   Lab Results  Component Value Date   CHOL 200 09/07/2023   HDL 51.70 09/07/2023   LDLCALC 124 (H) 09/07/2023   TRIG 121.0 09/07/2023   CHOLHDL 4 09/07/2023   Lab Results  Component Value Date   VD25OH 22.28 (L) 09/07/2023   Routine general medical examination at a health care facility Assessment &  Plan: We discussed the importance of regular physical activity and healthy diet for prevention of chronic illness and/or complications. Preventive guidelines reviewed. Vaccination up to date. She sees gynecologist for her female preventive care, pap smear up to date. Next CPE in a year.   Dyslipidemia Assessment & Plan: Low fat diet recommended Further recommendations according to FLP result.  Orders: -     Basic metabolic panel with GFR; Future -     Lipid panel; Future  Vitamin D deficiency, unspecified Assessment & Plan: Continue OTC Vit D 2000 U daily. Further recommendations according to lab results.  Orders: -     Basic metabolic panel with GFR; Future -     VITAMIN D 25 Hydroxy (Vit-D Deficiency, Fractures); Future  Elevated ALT measurement Assessment & Plan: Problem has been stable. ? Hepatic steatosis. Wt loss, increase vegetables intake, and decrease carb intake are recommended.  Orders: -     Hepatic function panel; Future  Elevated blood pressure reading Assessment & Plan: No hx of HTN. We discussed possible complications of elevated BP. Recommend monitoring BP at home for 2 weeks daily, am and pm, and to let me know about readings in 2 weeks. If still elevated >130/80, we need to consider pharmacologic treatment, Amlodipine will be considered.   Elevated glucose -     Hemoglobin A1c; Future   Return in 1 year (on 09/06/2024) for CPE, before if BP still elevated.Trula Ore, acting as a scribe for Lucca Greggs [place], MD., have documented all relevant documentation on the behalf of Dyanne Yorks [place], MD, as directed by  Odella Appelhans [place], MD while in the presence of Prabhnoor Ellenberger [place], MD.   I, Gaege Sangalang [place], MD, have reviewed all documentation for this visit. The documentation on 09/07/23 for the exam, diagnosis, procedures, and orders are all accurate and complete.  Makaylie Dedeaux G. [place], MD  [facility]. [place] office.

## 2023-09-07 NOTE — Patient Instructions (Addendum)
 A few things to remember from today's visit:  Routine general medical examination at a health care facility  Dyslipidemia - Plan: Basic metabolic panel with GFR, Lipid panel  Vitamin D deficiency, unspecified - Plan: Basic metabolic panel with GFR, VITAMIN D 25 Hydroxy (Vit-D Deficiency, Fractures)  Elevated ALT measurement - Plan: Hepatic function panel  Let me know about blood pressure readings in 2-3 weeks, it should be 120/70's. If still elevated we need to discuss medication options.  Do not use My Chart to request refills or for acute issues that need immediate attention. If you send a my chart message, it may take a few days to be addressed, specially if I am not in the office.  Please be sure medication list is accurate. If a new problem present, please set up appointment sooner than planned today.  Health Maintenance, Female Adopting a healthy lifestyle and getting preventive care are important in promoting health and wellness. Ask your health care provider about: The right schedule for you to have regular tests and exams. Things you can do on your own to prevent diseases and keep yourself healthy. What should I know about diet, weight, and exercise? Eat a healthy diet  Eat a diet that includes plenty of vegetables, fruits, low-fat dairy products, and lean protein. Do not eat a lot of foods that are high in solid fats, added sugars, or sodium. Maintain a healthy weight Body mass index (BMI) is used to identify weight problems. It estimates body fat based on height and weight. Your health care provider can help determine your BMI and help you achieve or maintain a healthy weight. Get regular exercise Get regular exercise. This is one of the most important things you can do for your health. Most adults should: Exercise for at least 150 minutes each week. The exercise should increase your heart rate and make you sweat (moderate-intensity exercise). Do strengthening exercises at  least twice a week. This is in addition to the moderate-intensity exercise. Spend less time sitting. Even light physical activity can be beneficial. Watch cholesterol and blood lipids Have your blood tested for lipids and cholesterol at 28 years of age, then have this test every 5 years. Have your cholesterol levels checked more often if: Your lipid or cholesterol levels are high. You are older than 28 years of age. You are at high risk for heart disease. What should I know about cancer screening? Depending on your health history and family history, you may need to have cancer screening at various ages. This may include screening for: Breast cancer. Cervical cancer. Colorectal cancer. Skin cancer. Lung cancer. What should I know about heart disease, diabetes, and high blood pressure? Blood pressure and heart disease High blood pressure causes heart disease and increases the risk of stroke. This is more likely to develop in people who have high blood pressure readings or are overweight. Have your blood pressure checked: Every 3-5 years if you are 5-46 years of age. Every year if you are 75 years old or older. Diabetes Have regular diabetes screenings. This checks your fasting blood sugar level. Have the screening done: Once every three years after age 28 if you are at a normal weight and have a low risk for diabetes. More often and at a younger age if you are overweight or have a high risk for diabetes. What should I know about preventing infection? Hepatitis B If you have a higher risk for hepatitis B, you should be screened for this virus. Talk  with your health care provider to find out if you are at risk for hepatitis B infection. Hepatitis C Testing is recommended for: Everyone born from [birth year] through 1965. Anyone with known risk factors for hepatitis C. Sexually transmitted infections (STIs) Get screened for STIs, including gonorrhea and chlamydia, if: You are sexually active  and are younger than 28 years of age. You are older than 28 years of age and your health care provider tells you that you are at risk for this type of infection. Your sexual activity has changed since you were last screened, and you are at increased risk for chlamydia or gonorrhea. Ask your health care provider if you are at risk. Ask your health care provider about whether you are at high risk for HIV. Your health care provider may recommend a prescription medicine to help prevent HIV infection. If you choose to take medicine to prevent HIV, you should first get tested for HIV. You should then be tested every 3 months for as long as you are taking the medicine. Pregnancy If you are about to stop having your period (premenopausal) and you may become pregnant, seek counseling before you get pregnant. Take 400 to 800 micrograms (mcg) of folic acid every day if you become pregnant. Ask for birth control (contraception) if you want to prevent pregnancy. Osteoporosis and menopause Osteoporosis is a disease in which the bones lose minerals and strength with aging. This can result in bone fractures. If you are 56 years old or older, or if you are at risk for osteoporosis and fractures, ask your health care provider if you should: Be screened for bone loss. Take a calcium or vitamin D supplement to lower your risk of fractures. Be given hormone replacement therapy (HRT) to treat symptoms of menopause. Follow these instructions at home: Alcohol use Do not drink alcohol if: Your health care provider tells you not to drink. You are pregnant, may be pregnant, or are planning to become pregnant. If you drink alcohol: Limit how much you have to: 0-1 drink a day. Know how much alcohol is in your drink. In the [place] one drink equals one 12 oz bottle of beer (355 mL), one 5 oz glass of wine (148 mL), or one 1 oz glass of hard liquor (44 mL). Lifestyle Do not use any products that contain nicotine or tobacco.  These products include cigarettes, chewing tobacco, and vaping devices, such as e-cigarettes. If you need help quitting, ask your health care provider. Do not use street drugs. Do not share needles. Ask your health care provider for help if you need support or information about quitting drugs. General instructions Schedule regular health, dental, and eye exams. Stay current with your vaccines. Tell your health care provider if: You often feel depressed. You have ever been abused or do not feel safe at home. Summary Adopting a healthy lifestyle and getting preventive care are important in promoting health and wellness. Follow your health care provider's instructions about healthy diet, exercising, and getting tested or screened for diseases. Follow your health care provider's instructions on monitoring your cholesterol and blood pressure. This information is not intended to replace advice given to you by your health care provider. Make sure you discuss any questions you have with your health care provider. Document Revised: 10/06/2020 Document Reviewed: 10/06/2020 Elsevier Patient Education  2024 [employer].

## 2023-09-07 NOTE — Assessment & Plan Note (Signed)
Low fat diet recommended. Further recommendations according to FLP result.

## 2023-09-07 NOTE — Assessment & Plan Note (Signed)
 We discussed the importance of regular physical activity and healthy diet for prevention of chronic illness and/or complications. Preventive guidelines reviewed. Vaccination up to date. She sees gynecologist for her female preventive care, pap smear up to date. Next CPE in a year.

## 2023-09-07 NOTE — Assessment & Plan Note (Signed)
 No hx of HTN. We discussed possible complications of elevated BP. Recommend monitoring BP at home for 2 weeks daily, am and pm, and to let me know about readings in 2 weeks. If still elevated >130/80, we need to consider pharmacologic treatment, Amlodipine will be considered.

## 2023-10-05 ENCOUNTER — Other Ambulatory Visit (HOSPITAL_COMMUNITY): Payer: Self-pay

## 2023-10-05 ENCOUNTER — Other Ambulatory Visit: Payer: Self-pay | Admitting: Family Medicine

## 2023-10-05 DIAGNOSIS — F331 Major depressive disorder, recurrent, moderate: Secondary | ICD-10-CM | POA: Diagnosis not present

## 2023-10-05 DIAGNOSIS — F900 Attention-deficit hyperactivity disorder, predominantly inattentive type: Secondary | ICD-10-CM | POA: Diagnosis not present

## 2023-10-05 DIAGNOSIS — J4520 Mild intermittent asthma, uncomplicated: Secondary | ICD-10-CM

## 2023-10-05 DIAGNOSIS — F411 Generalized anxiety disorder: Secondary | ICD-10-CM | POA: Diagnosis not present

## 2023-10-05 MED ORDER — VENTOLIN HFA 108 (90 BASE) MCG/ACT IN AERS
1.0000 | INHALATION_SPRAY | RESPIRATORY_TRACT | 1 refills | Status: AC | PRN
  Filled 2023-10-05: qty 18, 30d supply, fill #0

## 2023-10-05 NOTE — Telephone Encounter (Signed)
 Copied from CRM [id number]. Topic: Clinical - Medication Refill >> Oct 05, 2023 10:10 AM Tanazia G wrote: Medication: VENTOLIN  HFA 108 (90 Base) MCG/ACT inhaler   Has the patient contacted their pharmacy? Yes (Agent: If no, request that the patient contact the pharmacy for the refill. If patient does not wish to contact the pharmacy document the reason why and proceed with request.) (Agent: If yes, when and what did the pharmacy advise?)  This is the patient's preferred pharmacy:   [facility] [address]  Is this the correct pharmacy for this prescription? Yes If no, delete pharmacy and type the correct one.   Has the prescription been filled recently? No  Is the patient out of the medication? Yes  Has the patient been seen for an appointment in the last year OR does the patient have an upcoming appointment? Yes  Can we respond through MyChart? Yes  Agent: Please be advised that Rx refills may take up to 3 business days. We ask that you follow-up with your pharmacy.

## 2023-10-11 DIAGNOSIS — F900 Attention-deficit hyperactivity disorder, predominantly inattentive type: Secondary | ICD-10-CM | POA: Diagnosis not present

## 2023-10-11 DIAGNOSIS — F411 Generalized anxiety disorder: Secondary | ICD-10-CM | POA: Diagnosis not present

## 2023-10-11 DIAGNOSIS — F331 Major depressive disorder, recurrent, moderate: Secondary | ICD-10-CM | POA: Diagnosis not present

## 2023-10-17 ENCOUNTER — Other Ambulatory Visit (HOSPITAL_COMMUNITY): Payer: Self-pay

## 2023-10-18 DIAGNOSIS — F900 Attention-deficit hyperactivity disorder, predominantly inattentive type: Secondary | ICD-10-CM | POA: Diagnosis not present

## 2023-10-18 DIAGNOSIS — F411 Generalized anxiety disorder: Secondary | ICD-10-CM | POA: Diagnosis not present

## 2023-10-18 DIAGNOSIS — F331 Major depressive disorder, recurrent, moderate: Secondary | ICD-10-CM | POA: Diagnosis not present

## 2023-11-15 IMAGING — DX DG CHEST 2V
2 series · 2 of 2 positions shown · non-contrast
Comparison: Chest radiograph 01/04/2014

CLINICAL DATA: Mild intermittent asthma with acute exacerbation.
Shortness of breath. Productive cough. Blood in mucus and wheezing.

EXAM:
CHEST - 2 VIEW

[chest pa]
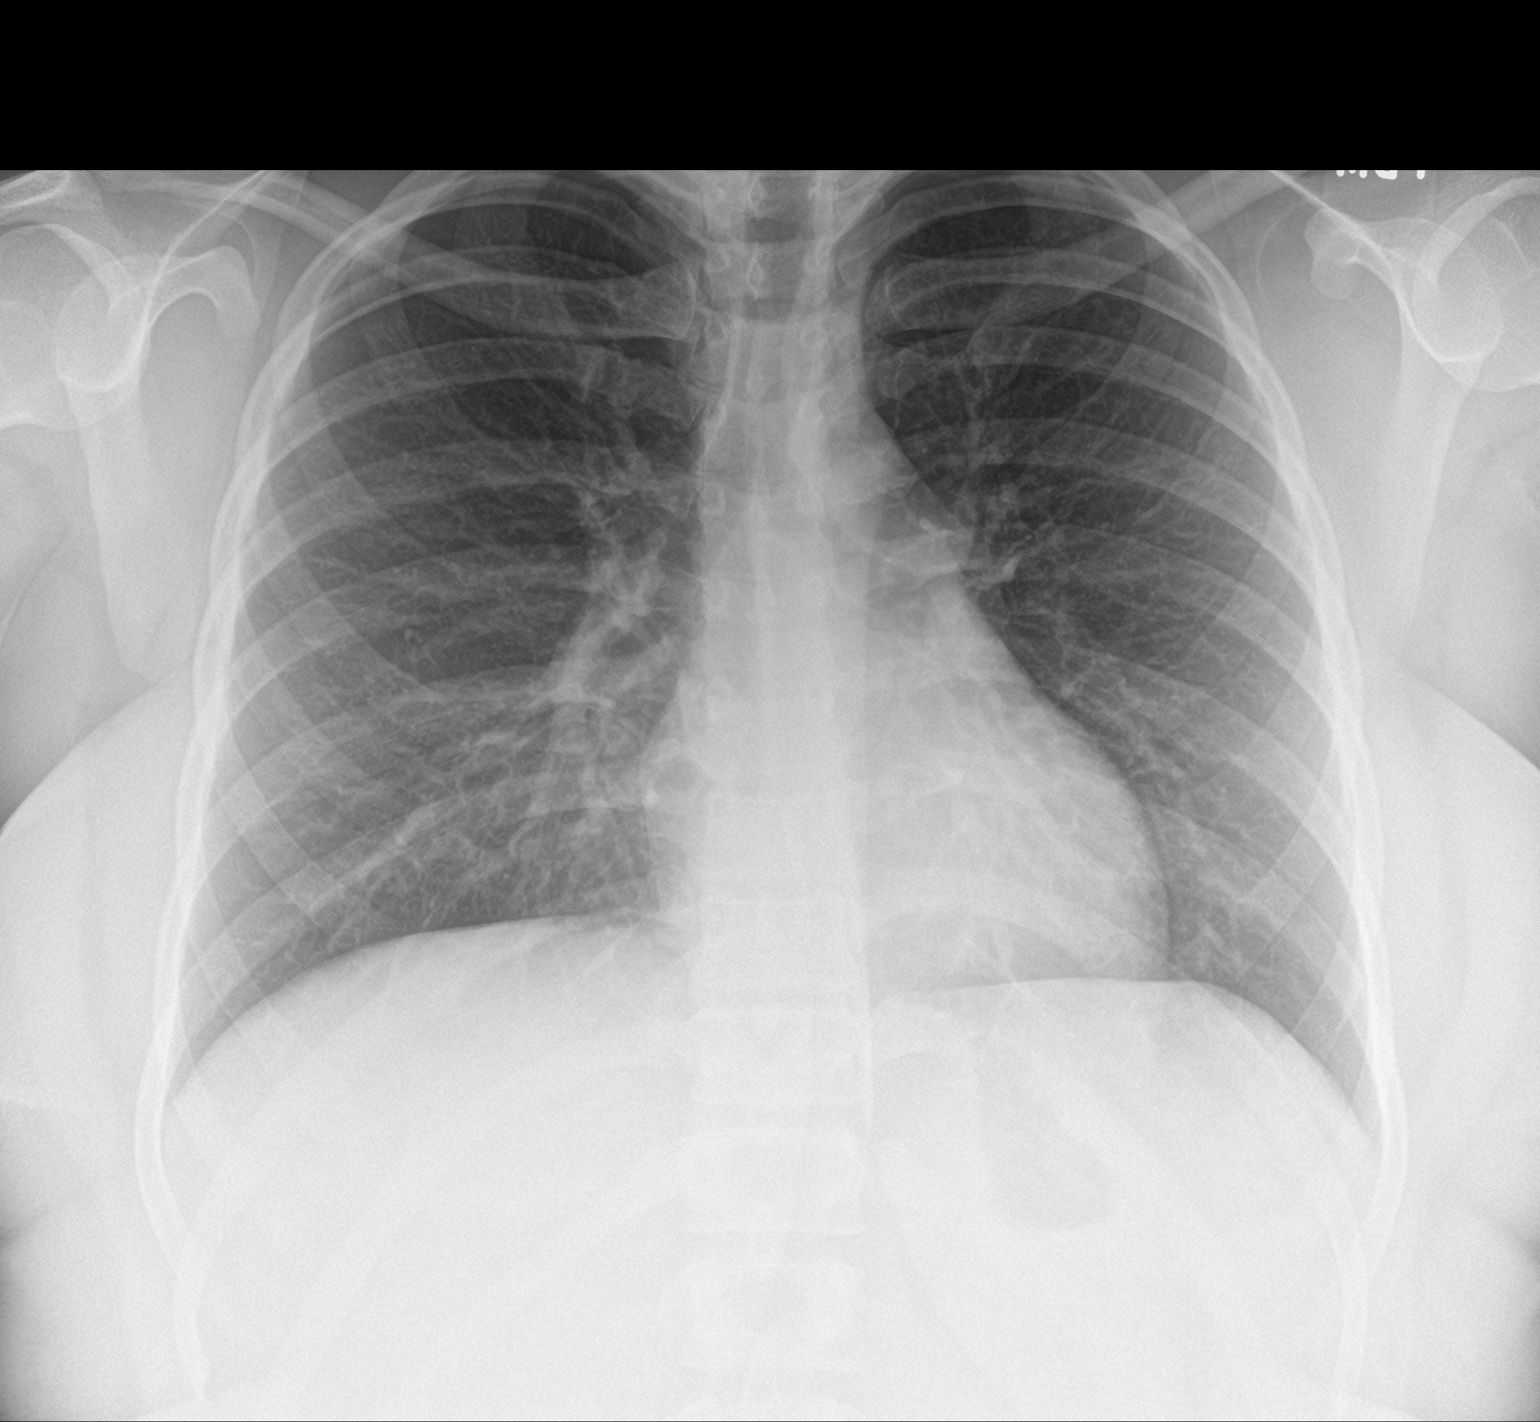

[chest lat]
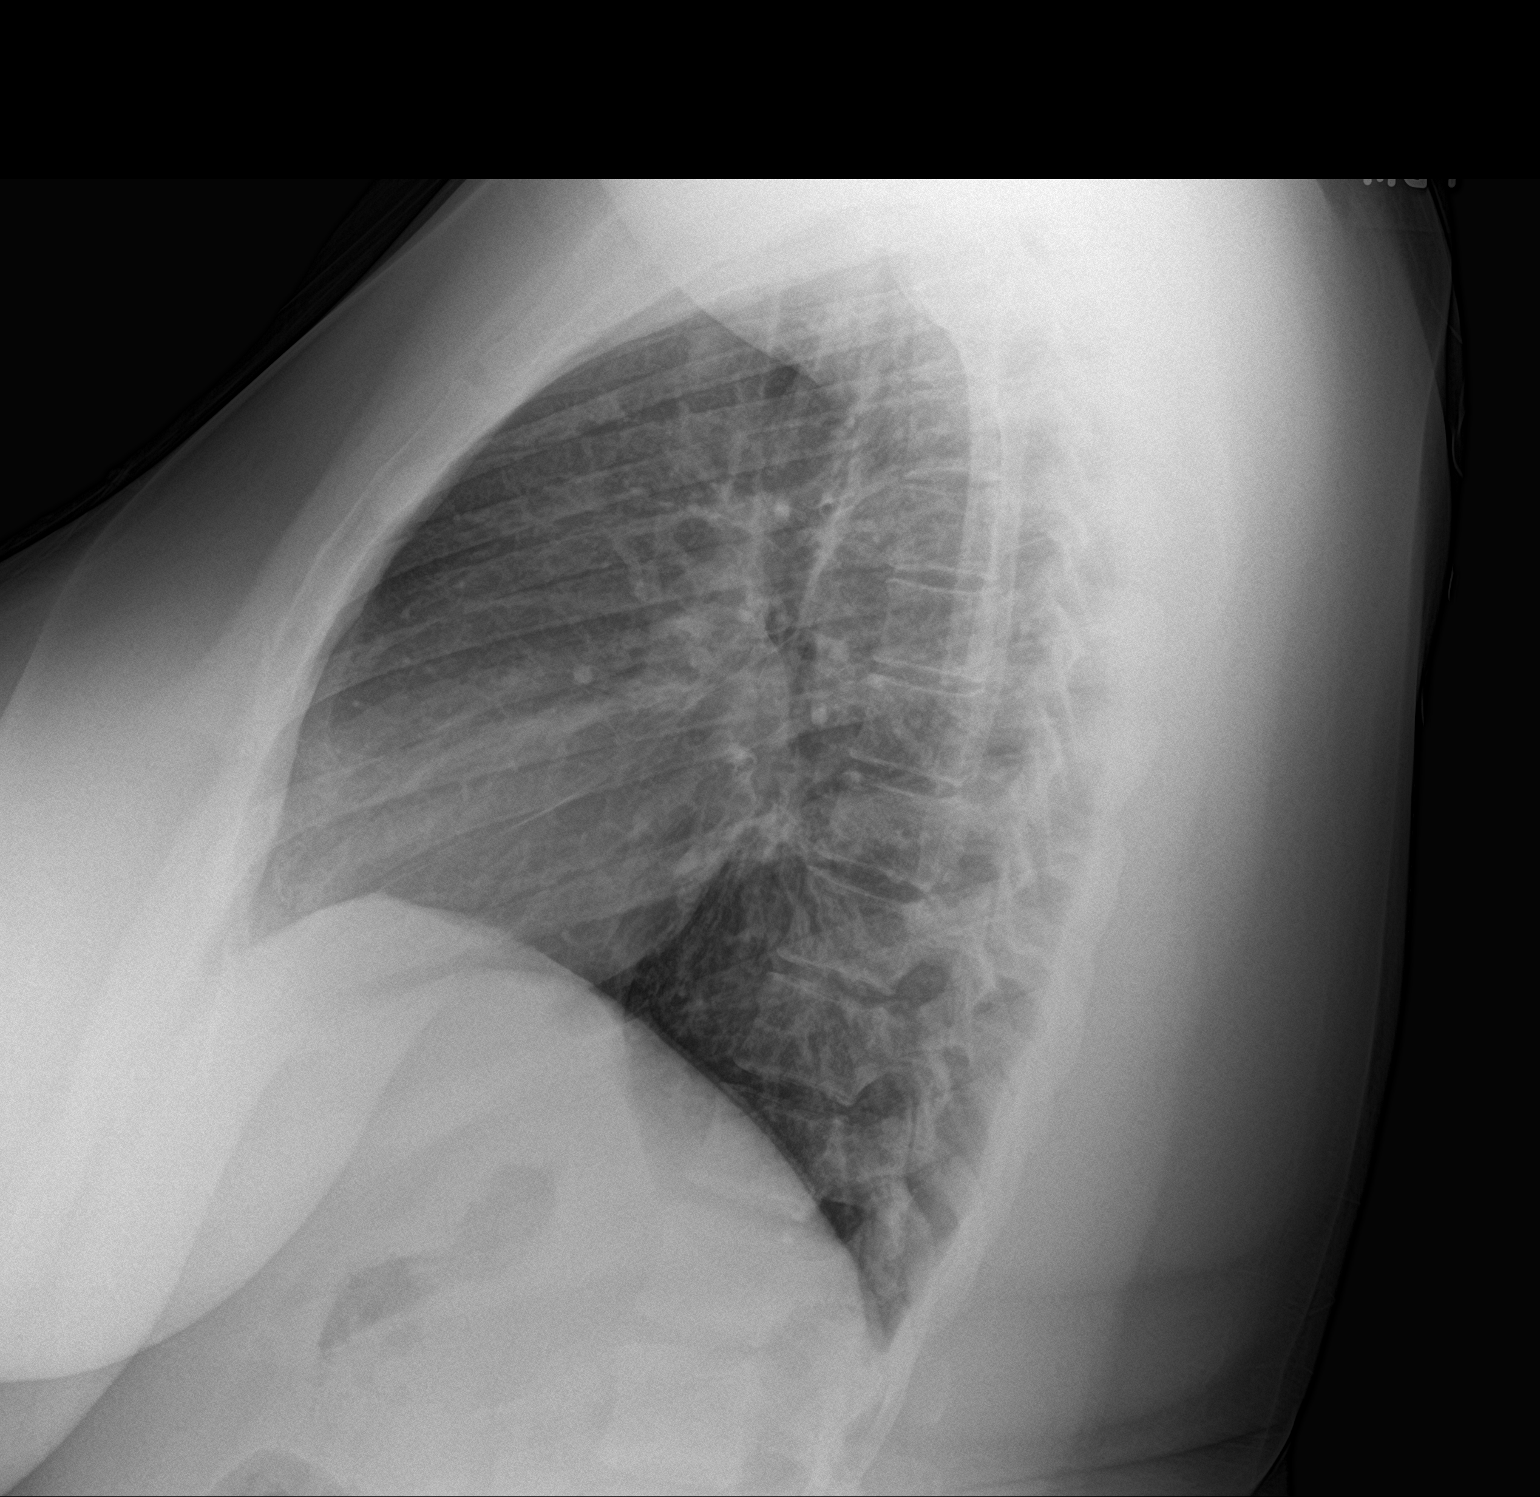

[2 of 2 positions shown; findings below may reference images not displayed]

FINDINGS: The cardiomediastinal contours are normal. Mild peribronchial
thickening. Pulmonary vasculature is normal. No consolidation,
pleural effusion, or pneumothorax. No acute osseous abnormalities
are seen.
IMPRESSION: Mild peribronchial thickening can be seen with asthma or bronchitis.
No consolidation.

## 2024-01-10 DIAGNOSIS — F900 Attention-deficit hyperactivity disorder, predominantly inattentive type: Secondary | ICD-10-CM | POA: Diagnosis not present

## 2024-01-10 DIAGNOSIS — F331 Major depressive disorder, recurrent, moderate: Secondary | ICD-10-CM | POA: Diagnosis not present

## 2024-01-10 DIAGNOSIS — F411 Generalized anxiety disorder: Secondary | ICD-10-CM | POA: Diagnosis not present

## 2024-01-25 ENCOUNTER — Other Ambulatory Visit: Payer: Self-pay | Admitting: Medical Genetics

## 2024-02-06 DIAGNOSIS — F900 Attention-deficit hyperactivity disorder, predominantly inattentive type: Secondary | ICD-10-CM | POA: Diagnosis not present

## 2024-02-06 DIAGNOSIS — F411 Generalized anxiety disorder: Secondary | ICD-10-CM | POA: Diagnosis not present

## 2024-02-06 DIAGNOSIS — F331 Major depressive disorder, recurrent, moderate: Secondary | ICD-10-CM | POA: Diagnosis not present

## 2024-02-07 ENCOUNTER — Other Ambulatory Visit

## 2024-03-09 ENCOUNTER — Other Ambulatory Visit: Payer: Self-pay | Admitting: Medical Genetics

## 2024-03-09 DIAGNOSIS — Z006 Encounter for examination for normal comparison and control in clinical research program: Secondary | ICD-10-CM

## 2024-04-16 ENCOUNTER — Other Ambulatory Visit (HOSPITAL_COMMUNITY): Payer: Self-pay

## 2024-04-23 ENCOUNTER — Ambulatory Visit: Admitting: Family Medicine

## 2024-04-23 VITALS — BP 116/90 | HR 88 | Temp 98.1°F | Resp 16 | Ht 62.0 in | Wt 241.6 lb

## 2024-04-23 DIAGNOSIS — Z23 Encounter for immunization: Secondary | ICD-10-CM

## 2024-04-23 DIAGNOSIS — R030 Elevated blood-pressure reading, without diagnosis of hypertension: Secondary | ICD-10-CM | POA: Diagnosis not present

## 2024-04-23 NOTE — Progress Notes (Signed)
 ACUTE VISIT Chief Complaint  Patient presents with   Acute Visit    Pt report dark spot on bottom of L foot.  started with a small dot then increased in size.    Discussed the use of AI scribe software for clinical note transcription with the patient, who gave verbal consent to proceed.  History of Present Illness Thanvi L Dorreen Valiente is a 28 year old female with PMHx significant for PCOS, asthma, insulin  resistance, dyslipidemia, vitamin D  deficiency, depression/anxiety, and ADHD here today because concerns about a dark spot on the bottom of her left foot.  Approximately two to three weeks ago, she noticed a dark spot on the bottom of her left foot. Since its appearance, the lesion has slightly increased in size but has not caused any pain or bleeding.  She has not used any over-the-counter treatments for the lesion as she was unsure of its nature.  There is no personal or family history of melanoma.   BP mildly elevated today. No hx of HTN but BP has been elevated before. Lab Results  Component Value Date   NA 136 09/07/2023   CL 102 09/07/2023   K 4.0 09/07/2023   CO2 27 09/07/2023   BUN 8 09/07/2023   CREATININE 0.84 09/07/2023   GFR 95.18 09/07/2023   CALCIUM 8.8 09/07/2023   ALBUMIN 4.2 09/07/2023   GLUCOSE 89 09/07/2023   Review of Systems  Constitutional:  Negative for activity change, appetite change and fever.  HENT:  Negative for mouth sores and sore throat.   Respiratory:  Negative for cough and shortness of breath.   Cardiovascular:  Negative for chest pain and leg swelling.  Gastrointestinal:  Negative for abdominal pain, nausea and vomiting.  Genitourinary:  Negative for decreased urine volume, difficulty urinating, dysuria and hematuria.  Neurological:  Negative for weakness and numbness.  See other pertinent positives and negatives in HPI.  Current Outpatient Medications on File Prior to Visit  Medication Sig Dispense Refill   Biotin w/ Vitamins C & E  (HAIR/SKIN/NAILS PO) Take by mouth.     FLUoxetine (PROZAC) 20 MG capsule Take 40 mg by mouth daily.     Multiple Vitamins-Minerals (MULTI-VITAMIN GUMMIES PO) Take by mouth.     Nebulizer MISC 1 nebulizer machine for home use 1 each 0   valACYclovir  (VALTREX ) 500 MG tablet TAKE 1 TABLET BY MOUTH EVERY 12 HOURS FOR 3 DAYS AS NEEDED FOR FLARE UPS ASAP 30 tablet 0   VENTOLIN  HFA 108 (90 Base) MCG/ACT inhaler Inhale 1-2 puffs into the lungs every 4 (four) hours as needed for wheezing or shortness of breath. 18 g 1   VYVANSE 40 MG capsule      WELLBUTRIN XL 150 MG 24 hr tablet      zolpidem (AMBIEN) 5 MG tablet Take 5 mg by mouth at bedtime.     No current facility-administered medications on file prior to visit.   Past Medical History:  Diagnosis Date   ADHD    Alcohol abuse    Anemia    Anxiety    Asthma    Constipation    Depression    Genital herpes    GERD (gastroesophageal reflux disease)    Infertility, female    Multiple food allergies    OCD (obsessive compulsive disorder)    PCOS (polycystic ovarian syndrome)    Vitamin D  deficiency    Allergies  Allergen Reactions   Corylus Anaphylaxis     Hazelnuts  Hazelnuts      Social History   Socioeconomic History   Marital status: Single    Spouse name: Not on file   Number of children: Not on file   Years of education: Not on file   Highest education level: Some college, no degree  Occupational History   Occupation: Animator rep  Tobacco Use   Smoking status: Never   Smokeless tobacco: Never  Substance and Sexual Activity   Alcohol use: No    Alcohol/week: 0.0 standard drinks of alcohol   Drug use: No   Sexual activity: Never    Birth control/protection: None  Other Topics Concern   Not on file  Social History Narrative   Not on file   Social Drivers of Health   Financial Resource Strain: High Risk (04/23/2024)   Overall Financial Resource Strain (CARDIA)    Difficulty of Paying Living Expenses:  Hard  Food Insecurity: Food Insecurity Present (04/23/2024)   Hunger Vital Sign    Worried About Running Out of Food in the Last Year: Sometimes true    Ran Out of Food in the Last Year: Sometimes true  Transportation Needs: No Transportation Needs (04/23/2024)   PRAPARE - Administrator, Civil Service (Medical): No    Lack of Transportation (Non-Medical): No  Physical Activity: Insufficiently Active (04/23/2024)   Exercise Vital Sign    Days of Exercise per Week: 3 days    Minutes of Exercise per Session: 30 min  Stress: Stress Concern Present (04/23/2024)   [employer] of Occupational Health - Occupational Stress Questionnaire    Feeling of Stress: To some extent  Social Connections: Socially Isolated (04/23/2024)   Social Connection and Isolation Panel    Frequency of Communication with Friends and Family: Once a week    Frequency of Social Gatherings with Friends and Family: Once a week    Attends Religious Services: More than 4 times per year    Active Member of Clubs or Organizations: No    Attends Banker Meetings: Not on file    Marital Status: Never married    Vitals:   04/23/24 1601 04/23/24 1624  BP: (!) 116/90   Pulse: (!) 110 88  Resp: 16   Temp: 98.1 F (36.7 C)   SpO2: 98%    Body mass index is 44.19 kg/m.  Physical Exam Vitals and nursing note reviewed.  Constitutional:      General: She is not in acute distress.    Appearance: She is well-developed.  HENT:     Head: Normocephalic and atraumatic.  Eyes:     Conjunctiva/sclera: Conjunctivae normal.  Cardiovascular:     Rate and Rhythm: Normal rate and regular rhythm.     Pulses:          Dorsalis pedis pulses are 2+ on the left side.  Pulmonary:     Effort: Pulmonary effort is normal. No respiratory distress.     Breath sounds: Normal breath sounds.  Musculoskeletal:       Feet:  Skin:    General: Skin is warm.     Findings: No erythema or rash.  Neurological:      General: No focal deficit present.     Mental Status: She is alert and oriented to person, place, and time.     Gait: Gait normal.  Psychiatric:        Mood and Affect: Mood and affect normal.     ASSESSMENT AND PLAN:  Claretta Sands  Jackson was seen today for concerns about left plantar dark spot.  -Concerns about plantar spot': On inspection there was a 1-2 mm reddish plantar crust, removed after rubbing area with a q tip a couple times. No bleeding or pain, skin is normal.   Elevated blood pressure reading DBP mildly elevated. BP was elevated last visit, 126/100,130/100. Initially mildly tachycardic, improved. She is on Vyvanse and Wellbutrin, both could be contributing factors. Instructed to monitor BP at home.  Influenza vaccine needed -     Flu vaccine trivalent PF, 6mos and older(Flulaval,Afluria,Fluarix,Fluzone)   Return if symptoms worsen or fail to improve, for keep next appointment.  Prather Failla G. Andreyah Natividad, MD  [facility]. [place] office.

## 2024-04-30 DIAGNOSIS — F900 Attention-deficit hyperactivity disorder, predominantly inattentive type: Secondary | ICD-10-CM | POA: Diagnosis not present

## 2024-04-30 DIAGNOSIS — F331 Major depressive disorder, recurrent, moderate: Secondary | ICD-10-CM | POA: Diagnosis not present

## 2024-04-30 DIAGNOSIS — F411 Generalized anxiety disorder: Secondary | ICD-10-CM | POA: Diagnosis not present

## 2024-05-26 ENCOUNTER — Emergency Department (HOSPITAL_COMMUNITY)

## 2024-05-26 ENCOUNTER — Emergency Department (HOSPITAL_COMMUNITY)
Admission: EM | Admit: 2024-05-26 | Discharge: 2024-05-27 | Disposition: A | Attending: Emergency Medicine | Admitting: Emergency Medicine

## 2024-05-26 ENCOUNTER — Encounter (HOSPITAL_COMMUNITY): Payer: Self-pay

## 2024-05-26 ENCOUNTER — Other Ambulatory Visit: Payer: Self-pay

## 2024-05-26 DIAGNOSIS — J029 Acute pharyngitis, unspecified: Secondary | ICD-10-CM | POA: Insufficient documentation

## 2024-05-26 NOTE — ED Triage Notes (Signed)
 Swallowed foreign body that was in her water bottle.   Says she has a cat and unsure if it knocked something in it.   Afterwards had globus sensation and coughed up small amount of blood.

## 2024-05-27 ENCOUNTER — Emergency Department (HOSPITAL_COMMUNITY)

## 2024-05-27 NOTE — ED Provider Notes (Signed)
 " [place] [facility]GENCY DEPARTMENT AT [facility] Provider Note   CSN: [id number] Arrival date & time: 05/26/24  2157     Patient presents with: Hemoptysis   Catherine Lane is a 28 y.o. female states that she would today drink out of her water bottle in the plastic ring that seals the water bottle had fallen inside and she almost swallowed it.  States that she feels that she cut the back of her throat.  When she spit it out there is a small amount of blood.  No shortness of breath, coughing, chest pain since that time though she does feel like her throat is sore.  History of PCOS, insulin  resistance, OCD.  She is not anticoagulated.  No immunocompromising conditions.   HPI     Prior to Admission medications  Medication Sig Start Date End Date Taking? Authorizing Provider  Biotin w/ Vitamins C & E (HAIR/SKIN/NAILS PO) Take by mouth.    [provider]  FLUoxetine (PROZAC) 20 MG capsule Take 40 mg by mouth daily.    [provider]  Multiple Vitamins-Minerals (MULTI-VITAMIN GUMMIES PO) Take by mouth.    [provider]  Nebulizer MISC 1 nebulizer machine for home use 09/22/19   Jarold, Olam HERO, PA-C  valACYclovir  (VALTREX ) 500 MG tablet TAKE 1 TABLET BY MOUTH EVERY 12 HOURS FOR 3 DAYS AS NEEDED FOR FLARE UPS ASAP 09/22/21   Jordan, Betty G, MD  VENTOLIN  HFA 108 [phone] Base) MCG/ACT inhaler Inhale 1-2 puffs into the lungs every 4 (four) hours as needed for wheezing or shortness of breath. 10/05/23   Jordan, Betty G, MD  VYVANSE 40 MG capsule     [provider]  WELLBUTRIN XL 150 MG 24 hr tablet     [provider]  zolpidem (AMBIEN) 5 MG tablet Take 5 mg by mouth at bedtime. 12/22/23   [provider]    Allergies: Corylus    Review of Systems  HENT:  Positive for sore throat.     Updated Vital Signs BP (!) 135/101 (BP Location: Right Arm)   Pulse 100   Temp 98.3 F (36.8 C) (Oral)   Resp 17   SpO2 100%    Physical Exam Vitals and nursing note reviewed.  Constitutional:      Appearance: She is obese. She is not ill-appearing or toxic-appearing.  HENT:     Head: Normocephalic and atraumatic.     Mouth/Throat:     Pharynx: Oropharynx is clear. Uvula midline.     Tonsils: No tonsillar exudate.   Eyes:     General: No scleral icterus.       Right eye: No discharge.        Left eye: No discharge.     Conjunctiva/sclera: Conjunctivae normal.  Cardiovascular:     Heart sounds: Normal heart sounds. No murmur heard. Pulmonary:     Effort: Pulmonary effort is normal.  Abdominal:     Palpations: Abdomen is soft.  Musculoskeletal:     Right lower leg: No edema.     Left lower leg: No edema.  Skin:    General: Skin is warm and dry.     Capillary Refill: Capillary refill takes less than 2 seconds.  Neurological:     General: No focal deficit present.     Mental Status: She is alert and oriented to person, place, and time.  Psychiatric:        Mood and Affect: Mood normal.     (  all labs ordered are listed, but only abnormal results are displayed) Labs Reviewed - No data to display  EKG: None  Radiology: DG Abdomen 1 View Result Date: 05/27/2024 EXAM: 1 VIEW XRAY OF THE ABDOMEN 05/27/2024 12:29:00 AM COMPARISON: None available. CLINICAL HISTORY: FB ingestion? FINDINGS: BOWEL: Nonobstructive bowel gas pattern. SOFT TISSUES: No radiopaque foreign body identified. No abnormal calcifications. BONES: No acute fracture. IMPRESSION: 1. No radiopaque foreign body identified. Electronically signed by: Greig Pique MD 05/27/2024 01:14 AM EST RP Workstation: [id number]   DG Neck Soft Tissue Result Date: 05/26/2024 EXAM: 1 LATERAL VIEW XRAY OF THE SOFT TISSUE NECK 05/26/2024 10:28:56 PM COMPARISON: None available. CLINICAL HISTORY: foreign body foreign body FINDINGS: SOFT TISSUES: Prevertebral soft tissue are unremarkable. Overlying soft tissue excludes C7-T1 from view on lateral examination.  EPIGLOTTIS: No epiglottic thickening. BONES: Straightening of the cervical spine. C7 - T1 excluded from view on lateral examination due to overlying soft tissue. No acute fracture or listhesis at the visualized cervical spine. IMPRESSION: 1. nonvisualization of C7-T1 on the lateral view due to overlying soft tissue. 2. No acute fracture or listhesis at the visualized cervical spine. 3. Straightening of the cervical spine Electronically signed by: Dorethia Molt MD 05/26/2024 11:36 PM EST RP Workstation: [id number]   DG Chest 1 View Result Date: 05/26/2024 EXAM: 1 VIEW(S) XRAY OF THE CHEST 05/26/2024 10:28:56 PM COMPARISON: Chest x-ray dated 07/17/2021. CLINICAL HISTORY: swallowed foreign body; hemoptysis FINDINGS: LUNGS AND PLEURA: No focal pulmonary opacity. No pleural effusion. No pneumothorax. No radiopaque foreign body. HEART AND MEDIASTINUM: No acute abnormality of the cardiac and mediastinal silhouettes. BONES AND SOFT TISSUES: No acute osseous abnormality. IMPRESSION: 1. No radiopaque foreign body or other acute abnormality. Electronically signed by: Greig Pique MD 05/26/2024 11:35 PM EST RP Workstation: [id number]     Procedures   Medications Ordered in the ED - No data to display                                  Medical Decision Making 28 year old female with concern for sore throat after accidentally almost swallowing plastic piece from her water bottle.  Hypertensive on intake, vital signs otherwise normal.  Cardiopulmonary exam unremarkable, abdominal exam is benign.  Patient is phonating normally, tolerating her own secretions.  Oropharyngeal exam as above.  Amount and/or Complexity of Data Reviewed Radiology: ordered.    Details: Images obtained from triage to evaluate for foreign body, did not reveal any retained radiopaque objects   Clinical picture most consistent with acute trauma to the oropharynx secondary to accidentally ingested foreign body.  No evidence of retained  radiopaque foreign body on imaging and patient reports that the object that she almost swallowed she did spit out.  Clinical concern for emergent underlying condition that would warrant further ED workup or inpatient management is exceedingly low.  Patient educated that her oropharyngeal tissues will heal quickly, low risk injury for infection.  Aybree voiced understanding of her medical evaluation and treatment plan. Each of their questions answered to their expressed satisfaction.  Return precautions were given.  Patient is well-appearing, stable, and was discharged in good condition.  This chart was dictated using voice recognition software, Dragon. Despite the best efforts of this provider to proofread and correct errors, errors may still occur which can change documentation meaning.      Final diagnoses:  Sore throat    ED Discharge Orders     None  Bobette Pleasant JONELLE DEVONNA 05/27/24 0529    Griselda Norris, MD 05/27/24 [phone]  "

## 2024-05-27 NOTE — Discharge Instructions (Signed)
 You have an abrasion to your throat which should heal on its on in the next few days. You may use ibuprofen  or tylenol  as needed for your symptoms Return to the ER with any new severe symptoms.
